# Patient Record
Sex: Female | Born: 1995 | Race: White | Hispanic: No | Marital: Married | Smoking: Never smoker
Health system: Southern US, Community
[De-identification: ages and names within clinical notes are randomized; demographics above are authoritative.]

## PROBLEM LIST (undated history)

## (undated) DIAGNOSIS — Q633 Hyperplastic and giant kidney: Secondary | ICD-10-CM

## (undated) DIAGNOSIS — R519 Headache, unspecified: Secondary | ICD-10-CM

## (undated) DIAGNOSIS — L659 Nonscarring hair loss, unspecified: Secondary | ICD-10-CM

## (undated) DIAGNOSIS — G4730 Sleep apnea, unspecified: Secondary | ICD-10-CM

## (undated) HISTORY — DX: Hyperplastic and giant kidney: Q63.3

## (undated) HISTORY — DX: Headache, unspecified: R51.9

## (undated) HISTORY — PX: NASAL SINUS SURGERY: SHX719

---
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
## 2014-05-01 IMAGING — CT CT PARANASAL SINUSES W/O CM
1 of 2 series · 13 of 30 positions shown, 17 images · non-contrast
Comparison: none

REASON FOR EXAM: Chronic Sinistius With Landmark See Scanned Notes
Protocol
COMMENTS:

PROCEDURE:     CT  - CT SINUSES WITHOUT CONTRAST  - May 22, 2012  [DATE]
RESULT:
TECHNIQUE: Multiplanar imaging of the paranasal sinuses was obtained
utilizing helical 2 mm acquisition and bone reconstruction algorithm.

[Series 3: axial facial 2.0 h70h · axial · 0.32mm/px · z∈[+1152,+1304]mm · 13 of 177 slices shown, 17 images]
[im 13/177  brain]
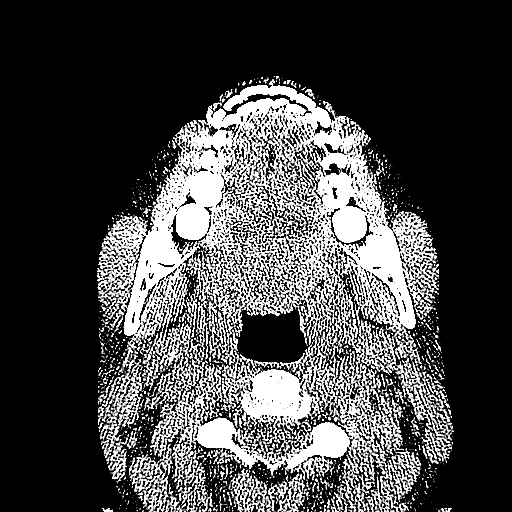
[im 13/177  bone]
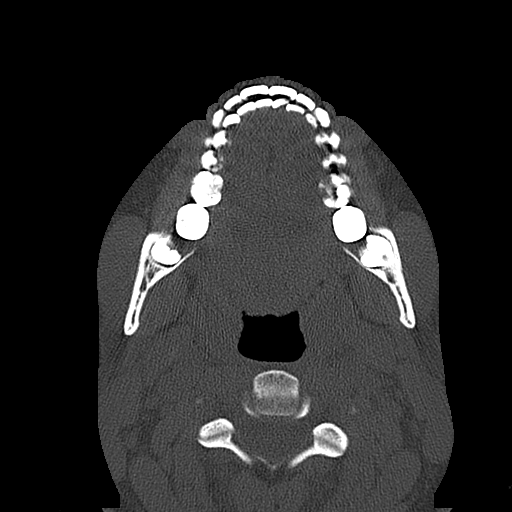
[im 26/177  bone]
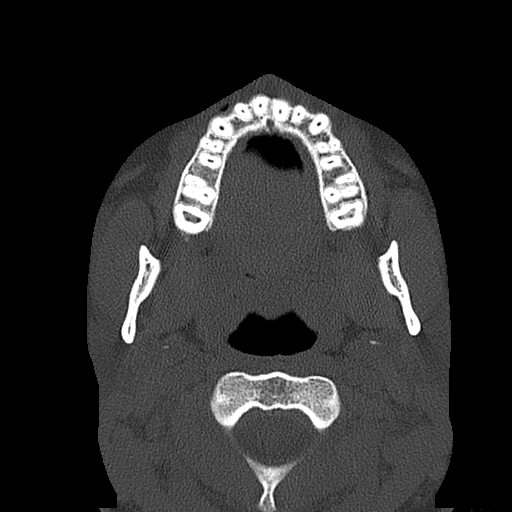
[im 38/177  bone]
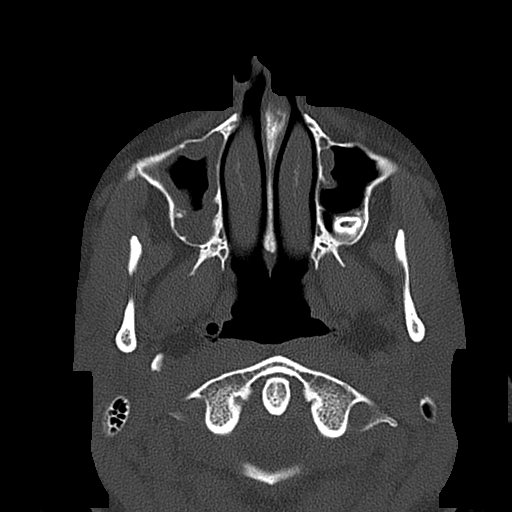
[im 51/177  bone]
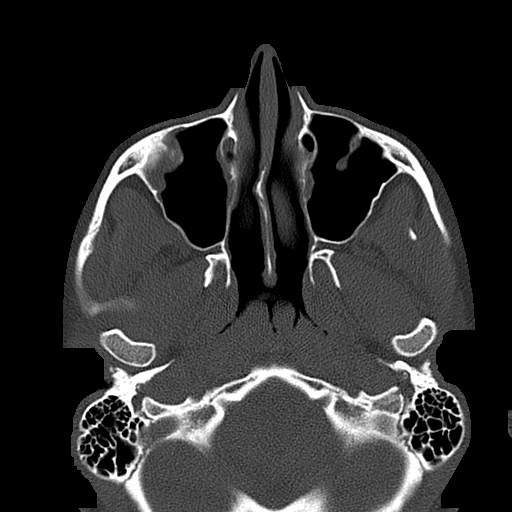
[im 63/177  brain]
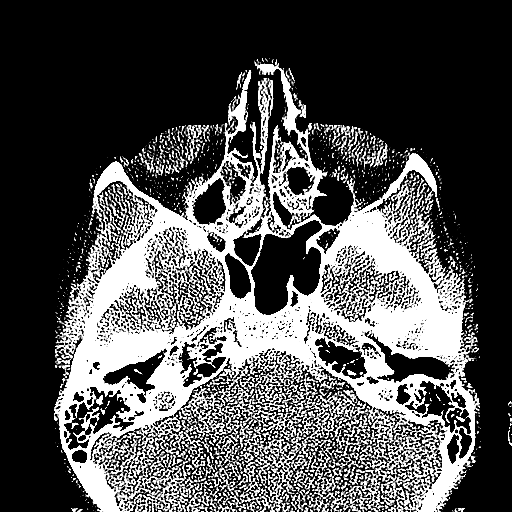
[im 63/177  bone]
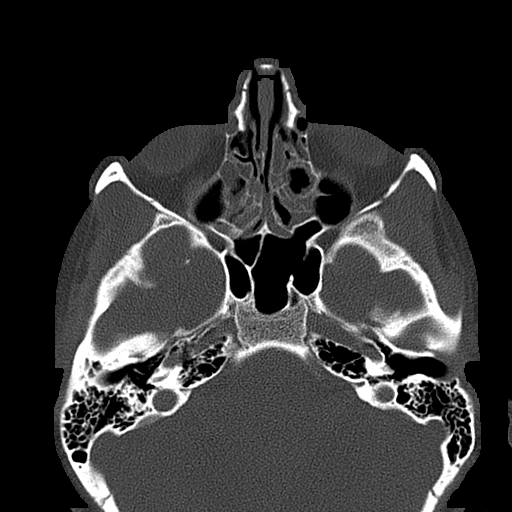
[im 76/177  bone]
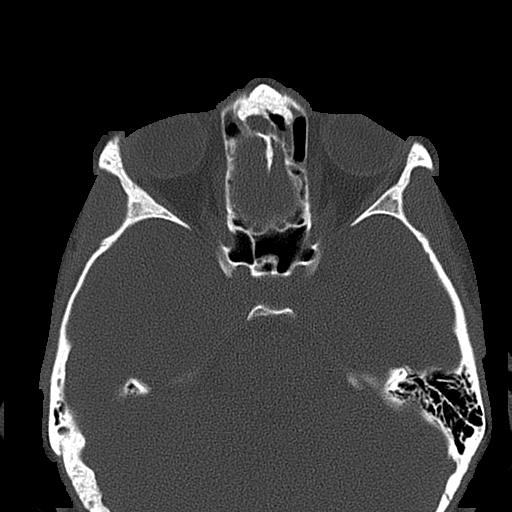
[im 89/177  bone]
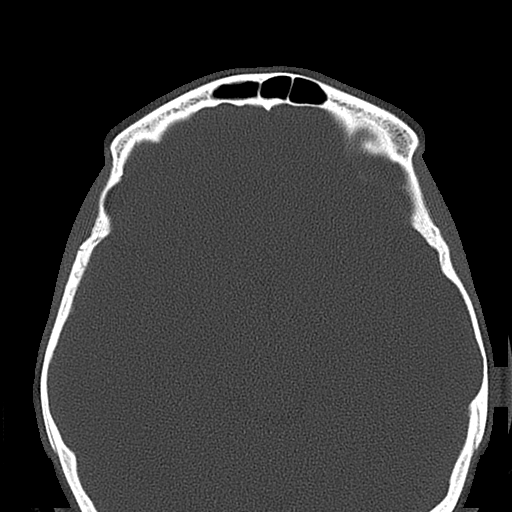
[im 101/177  bone]
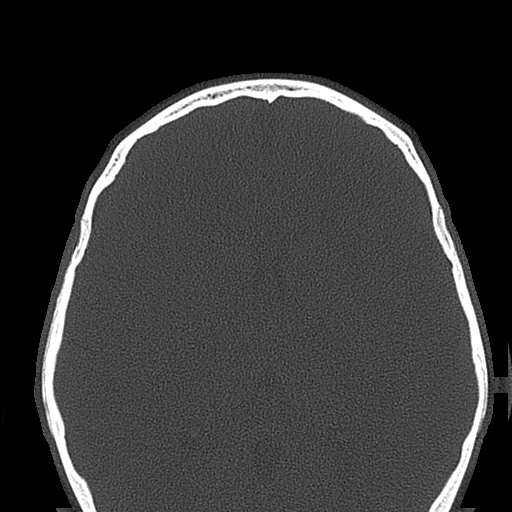
[im 114/177  brain]
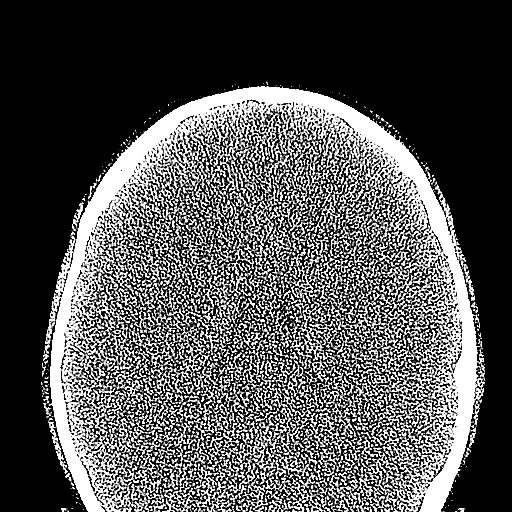
[im 114/177  bone]
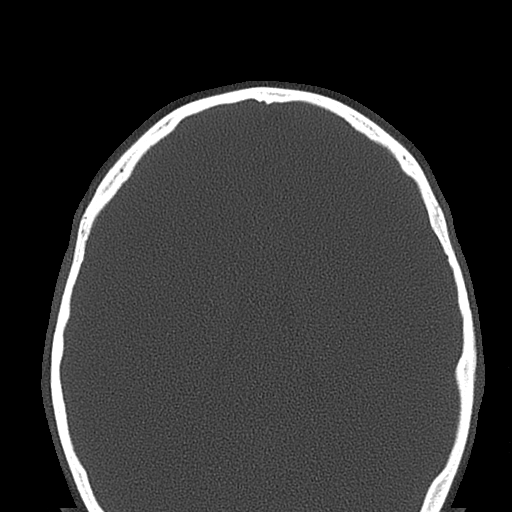
[im 126/177  bone]
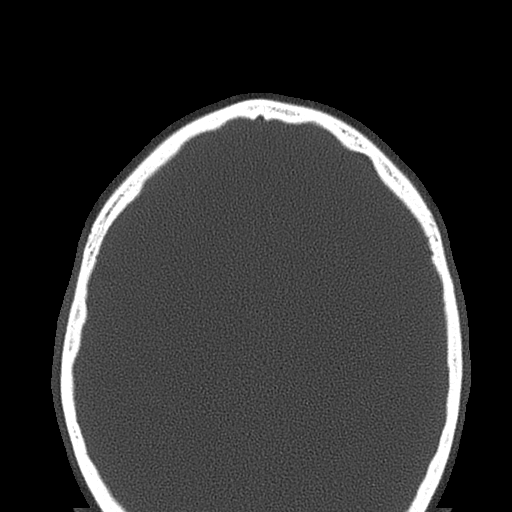
[im 139/177  bone]
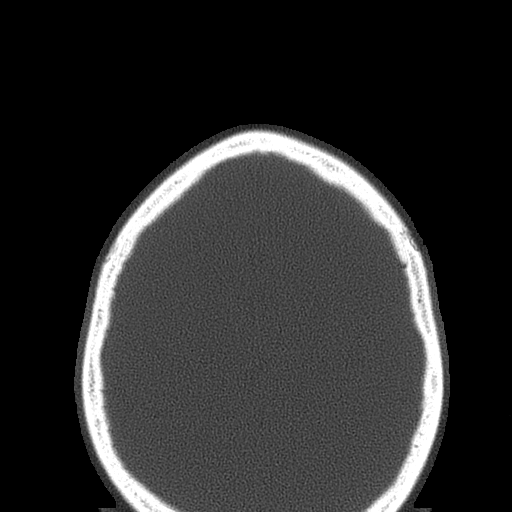
[im 151/177  bone]
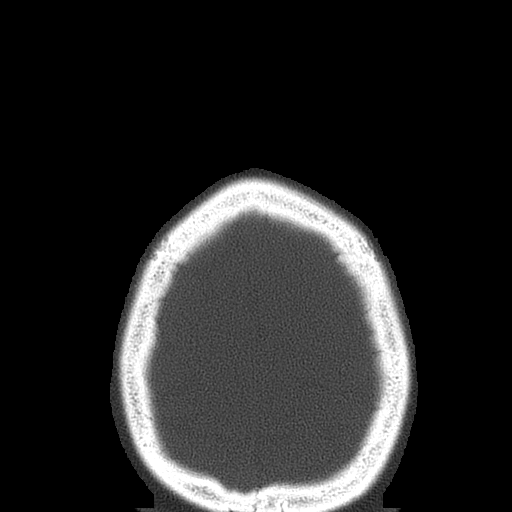
[im 164/177  brain]
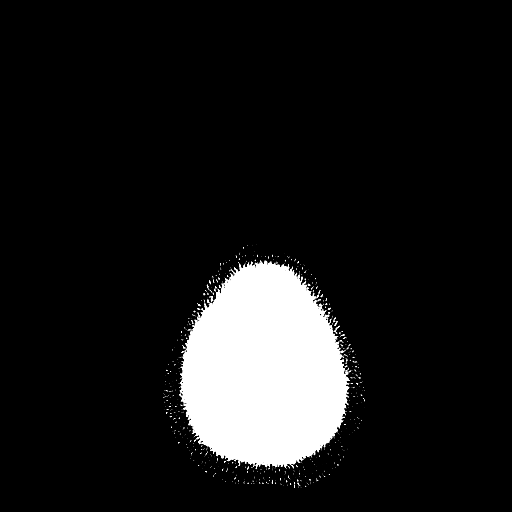
[im 164/177  bone]
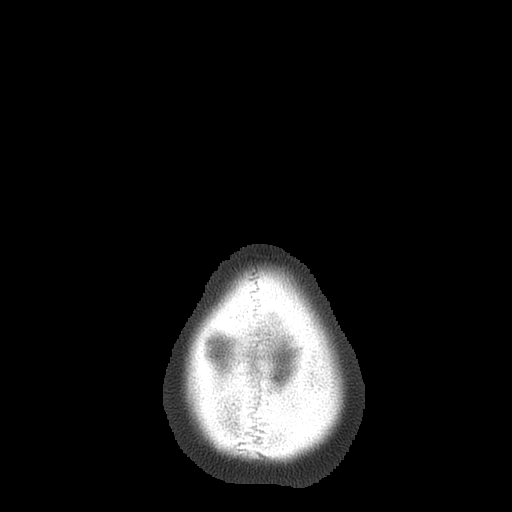

[13 of 30 positions shown; findings below may reference images not displayed]

FINDINGS: Diffuse mucosal thickening is identified within the ethmoid air
cells, right maxillary sinus, and to a lesser extent the left maxillary
sinus, and minimal regions in the frontal sinuses. There are findings
concerning for an impaction of the third molar within the base of the left
maxillary sinus. Concha bullosa is identified involving the superior and
middle turbinates. The turbinates are otherwise unremarkable. There is mild
to moderate ostial narrowing on the ostia of the right maxillary sinus with
near complete stenosis on the left.
IMPRESSION: 1.  Findings concerning for sinus disease within the ethmoid air cells,
right maxillary sinus and to a lesser extent the frontal sinus and left
maxillary sinus.
2.  Findings concerning for a component of impaction of the third molar into
the left maxillary sinus.

## 2015-11-08 DIAGNOSIS — G43111 Migraine with aura, intractable, with status migrainosus: Secondary | ICD-10-CM | POA: Insufficient documentation

## 2015-11-10 ENCOUNTER — Other Ambulatory Visit: Payer: Self-pay | Admitting: Neurology

## 2015-11-10 DIAGNOSIS — G43111 Migraine with aura, intractable, with status migrainosus: Secondary | ICD-10-CM

## 2015-11-25 ENCOUNTER — Ambulatory Visit
Admission: RE | Admit: 2015-11-25 | Discharge: 2015-11-25 | Disposition: A | Discharge status: Home / Self Care | Payer: BLUE CROSS/BLUE SHIELD | Source: Ambulatory Visit | Attending: Neurology | Admitting: Neurology

## 2015-11-25 DIAGNOSIS — R600 Localized edema: Secondary | ICD-10-CM | POA: Diagnosis not present

## 2015-11-25 DIAGNOSIS — G43111 Migraine with aura, intractable, with status migrainosus: Secondary | ICD-10-CM | POA: Diagnosis not present

## 2015-11-25 DIAGNOSIS — J3489 Other specified disorders of nose and nasal sinuses: Secondary | ICD-10-CM | POA: Diagnosis not present

## 2015-11-25 MED ORDER — GADOBENATE DIMEGLUMINE 529 MG/ML IV SOLN
15.0000 mL | Freq: Once | INTRAVENOUS | Status: AC | PRN
  Administered 2015-11-25: 15 mL via INTRAVENOUS

## 2016-03-19 ENCOUNTER — Encounter: Payer: Self-pay | Admitting: *Deleted

## 2016-03-19 DIAGNOSIS — J069 Acute upper respiratory infection, unspecified: Secondary | ICD-10-CM | POA: Insufficient documentation

## 2016-03-19 DIAGNOSIS — R091 Pleurisy: Secondary | ICD-10-CM | POA: Insufficient documentation

## 2016-03-19 DIAGNOSIS — R509 Fever, unspecified: Secondary | ICD-10-CM | POA: Diagnosis present

## 2016-03-19 LAB — URINALYSIS, COMPLETE (UACMP) WITH MICROSCOPIC
Bilirubin Urine: NEGATIVE
Glucose, UA: NEGATIVE mg/dL
Hgb urine dipstick: NEGATIVE
Ketones, ur: NEGATIVE mg/dL
Leukocytes, UA: NEGATIVE
Nitrite: NEGATIVE
PH: 7 (ref 5.0–8.0)
Protein, ur: NEGATIVE mg/dL
RBC / HPF: NONE SEEN RBC/hpf (ref 0–5)
SPECIFIC GRAVITY, URINE: 1.008 (ref 1.005–1.030)

## 2016-03-19 LAB — POCT PREGNANCY, URINE: Preg Test, Ur: NEGATIVE

## 2016-03-19 MED ORDER — ACETAMINOPHEN 500 MG PO TABS
1000.0000 mg | ORAL_TABLET | Freq: Once | ORAL | Status: AC
  Administered 2016-03-19: 1000 mg via ORAL

## 2016-03-19 MED ORDER — ACETAMINOPHEN 500 MG PO TABS
ORAL_TABLET | ORAL | Status: AC
  Filled 2016-03-19: qty 2

## 2016-03-19 NOTE — ED Triage Notes (Signed)
Pt reports back pain, chills, cough, and bodyaches.  Sx for 1day.  Pt has a headache.  Pt alert.  Speech clear.

## 2016-03-20 ENCOUNTER — Emergency Department
Admission: EM | Admit: 2016-03-20 | Discharge: 2016-03-20 | Disposition: A | Discharge status: Home / Self Care | Payer: BLUE CROSS/BLUE SHIELD | Attending: Emergency Medicine | Admitting: Emergency Medicine

## 2016-03-20 ENCOUNTER — Emergency Department: Payer: BLUE CROSS/BLUE SHIELD

## 2016-03-20 DIAGNOSIS — R091 Pleurisy: Secondary | ICD-10-CM

## 2016-03-20 DIAGNOSIS — J069 Acute upper respiratory infection, unspecified: Secondary | ICD-10-CM

## 2016-03-20 LAB — INFLUENZA PANEL BY PCR (TYPE A & B)
INFLBPCR: NEGATIVE
Influenza A By PCR: NEGATIVE

## 2016-03-20 MED ORDER — IBUPROFEN 600 MG PO TABS
600.0000 mg | ORAL_TABLET | Freq: Once | ORAL | Status: AC
  Administered 2016-03-20: 600 mg via ORAL
  Filled 2016-03-20: qty 1

## 2016-03-20 NOTE — ED Notes (Signed)
Report off to kailey rn  

## 2016-03-20 NOTE — ED Provider Notes (Signed)
[facility] Emergency Department Provider Note  ____________________________________________   First MD Initiated Contact with Patient 03/20/16 0157     (approximate)  I have reviewed the triage vital signs and the nursing notes.   HISTORY  Chief Complaint Back Pain   HPI Tracy Stevens is a 21 y.o. female without any chronic medical conditions was presenting emergency Department with 1 day of fever, congestion, frontal head pressure as well as cough and low back pain and body aches. She also says that she is having chest pain with a cough. 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 Says that she is also been diagnosed with the flu once this year.   No past medical history on file.  There are no active problems to display for this patient.   No past surgical history on file.  Prior to Admission medications   Not on File    Allergies Patient has no known allergies.  No family history on file.  Social History Social History  Substance Use Topics  . Smoking status: Never Smoker  . Smokeless tobacco: Never Used  . Alcohol use No    Review of Systems Constitutional: Fever Eyes: No visual changes. ENT: No sore throat. Cardiovascular: Diffuse chest pain also with deep breathing. Says this is chronic but worsened over the past day. Respiratory: Cough Gastrointestinal: No abdominal pain.  No nausea, no vomiting.  No diarrhea.  No constipation. Genitourinary: Negative for dysuria. Musculoskeletal: As above Skin: Negative for rash. Neurological: Negative for headaches, focal weakness or numbness.  10-point ROS otherwise negative.  ____________________________________________   PHYSICAL EXAM:  VITAL SIGNS: ED Triage Vitals  Enc Vitals Group     BP 03/19/16 2228 (!) 151/92     Pulse Rate 03/19/16 2228 (!) 128     Resp 03/19/16 2228 20     Temp 03/19/16 2228 100.3 F (37.9 C)     Temp Source 03/19/16 2228  Oral     SpO2 03/19/16 2228 100 %     Weight 03/19/16 2229 180 lb (81.6 kg)     Height 03/19/16 2229 5\' 5"  (1.651 m)     Head Circumference --      Peak Flow --      Pain Score 03/19/16 2229 8     Pain Loc --      Pain Edu? --      Excl. in GC? --     Constitutional: Alert and oriented. Well appearing and in no acute distress.  Temperature retaken and is 98.6. Eyes: Conjunctivae are normal. PERRL. EOMI. Head: Atraumatic. Nose: No congestion/rhinnorhea. Mouth/Throat: Mucous membranes are moist.  Oropharynx non-erythematous. Neck: No stridor.   Cardiovascular: Normal rate, regular rhythm. Grossly normal heart sounds.   Respiratory: Normal respiratory effort.  No retractions. Lungs CTAB. Gastrointestinal: Soft and nontender. No distention.  No CVA tenderness. Musculoskeletal: No lower extremity tenderness nor edema.  No joint effusions. Tenderness palpation to the thoracic nor her spine region. Neurologic:  Normal speech and language. No gross focal neurologic deficits are appreciated.  Skin:  Skin is warm, dry and intact. No rash noted. Psychiatric: Mood and affect are normal. Speech and behavior are normal.  ____________________________________________   LABS (all labs ordered are listed, but only abnormal results are displayed)  Labs Reviewed  URINALYSIS, COMPLETE (UACMP) WITH MICROSCOPIC - Abnormal; Notable for the following:       Result Value   Color, Urine STRAW (*)  APPearance CLEAR (*)    Bacteria, UA RARE (*)    Squamous Epithelial / LPF 0-5 (*)    All other components within normal limits  INFLUENZA PANEL BY PCR (TYPE A & B)  POC URINE PREG, ED  POCT PREGNANCY, URINE   ____________________________________________  EKG  ED ECG REPORT I, Schaevitz,  Teena Irani, the attending physician, personally viewed and interpreted this ECG.   Date: 03/20/2016  EKG Time: 0302  Rate: 90  Rhythm: normal sinus rhythm  Axis: Normal  Intervals:none  ST&T Change: No ST  segment elevation or depression. No abnormal T-wave inversion.  ____________________________________________  RADIOLOGY  DG Chest 2 View (Final result)  Result time 03/20/16 02:51:19  Final result by Charlott Holler, MD (03/20/16 02:51:19)           Narrative:   CLINICAL DATA: Cough, chills and back pain for 1 day.  EXAM: CHEST 2 VIEW  COMPARISON: None.  FINDINGS: The heart size and mediastinal contours are within normal limits. Both lungs are clear. The visualized skeletal structures are unremarkable.  IMPRESSION: No active cardiopulmonary disease.   Electronically Signed By: Ellery Plunk M.D. On: 03/20/2016 02:51            ____________________________________________   PROCEDURES  Procedure(s) performed:   Procedures  Critical Care performed:   ____________________________________________   INITIAL IMPRESSION / ASSESSMENT AND PLAN / ED COURSE  Pertinent labs & imaging results that were available during my care of the patient were reviewed by me and considered in my medical decision making (see chart for details).  ----------------------------------------- 3:54 AM on 03/20/2016 -----------------------------------------  Heart rate now 80. Patient says that the aching is improved. Likely viral illness causing her constellation of symptoms. Patient does have an element of pleurisy but very unlikely to be PE given her fever as well as nasal congestion and frontal headache. Most likely viral URI with pleurisy. She'll be following up with the [facility] where she is seen for primary care. She is understanding of the plan and willing to comply.      ____________________________________________   FINAL CLINICAL IMPRESSION(S) / ED DIAGNOSES  Pleurisy. URI.    NEW MEDICATIONS STARTED DURING THIS VISIT:  New Prescriptions   No medications on file     Note:  This document was prepared using Dragon voice recognition  software and may include unintentional dictation errors.    Myrna Blazer, MD 03/20/16 [phone]

## 2018-02-27 IMAGING — CR DG CHEST 2V
2 series · 2 of 2 positions shown · non-contrast
Comparison: None.

CLINICAL DATA: Cough, chills and back pain for 1 day.

EXAM:
CHEST  2 VIEW

[chest pa]
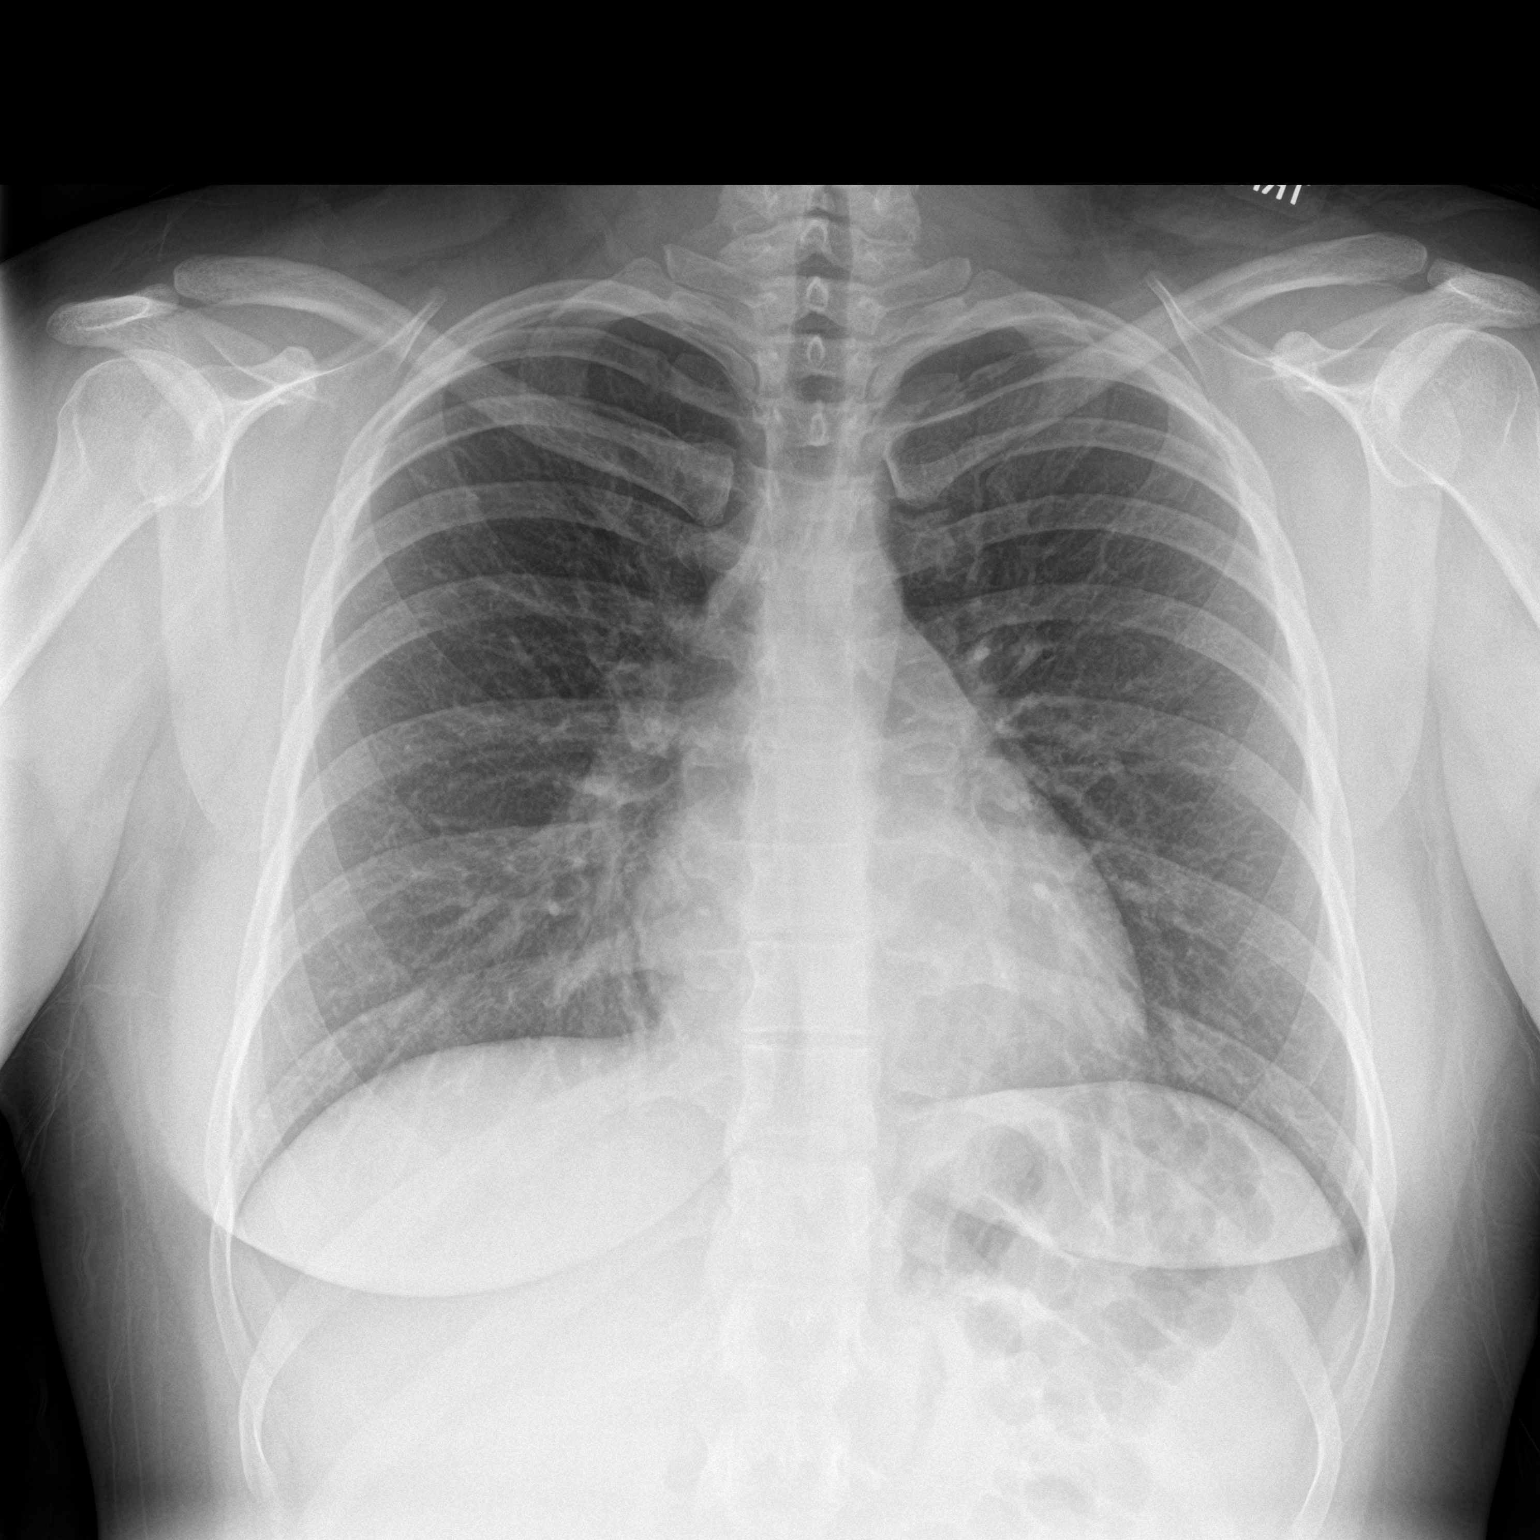

[chest lat]
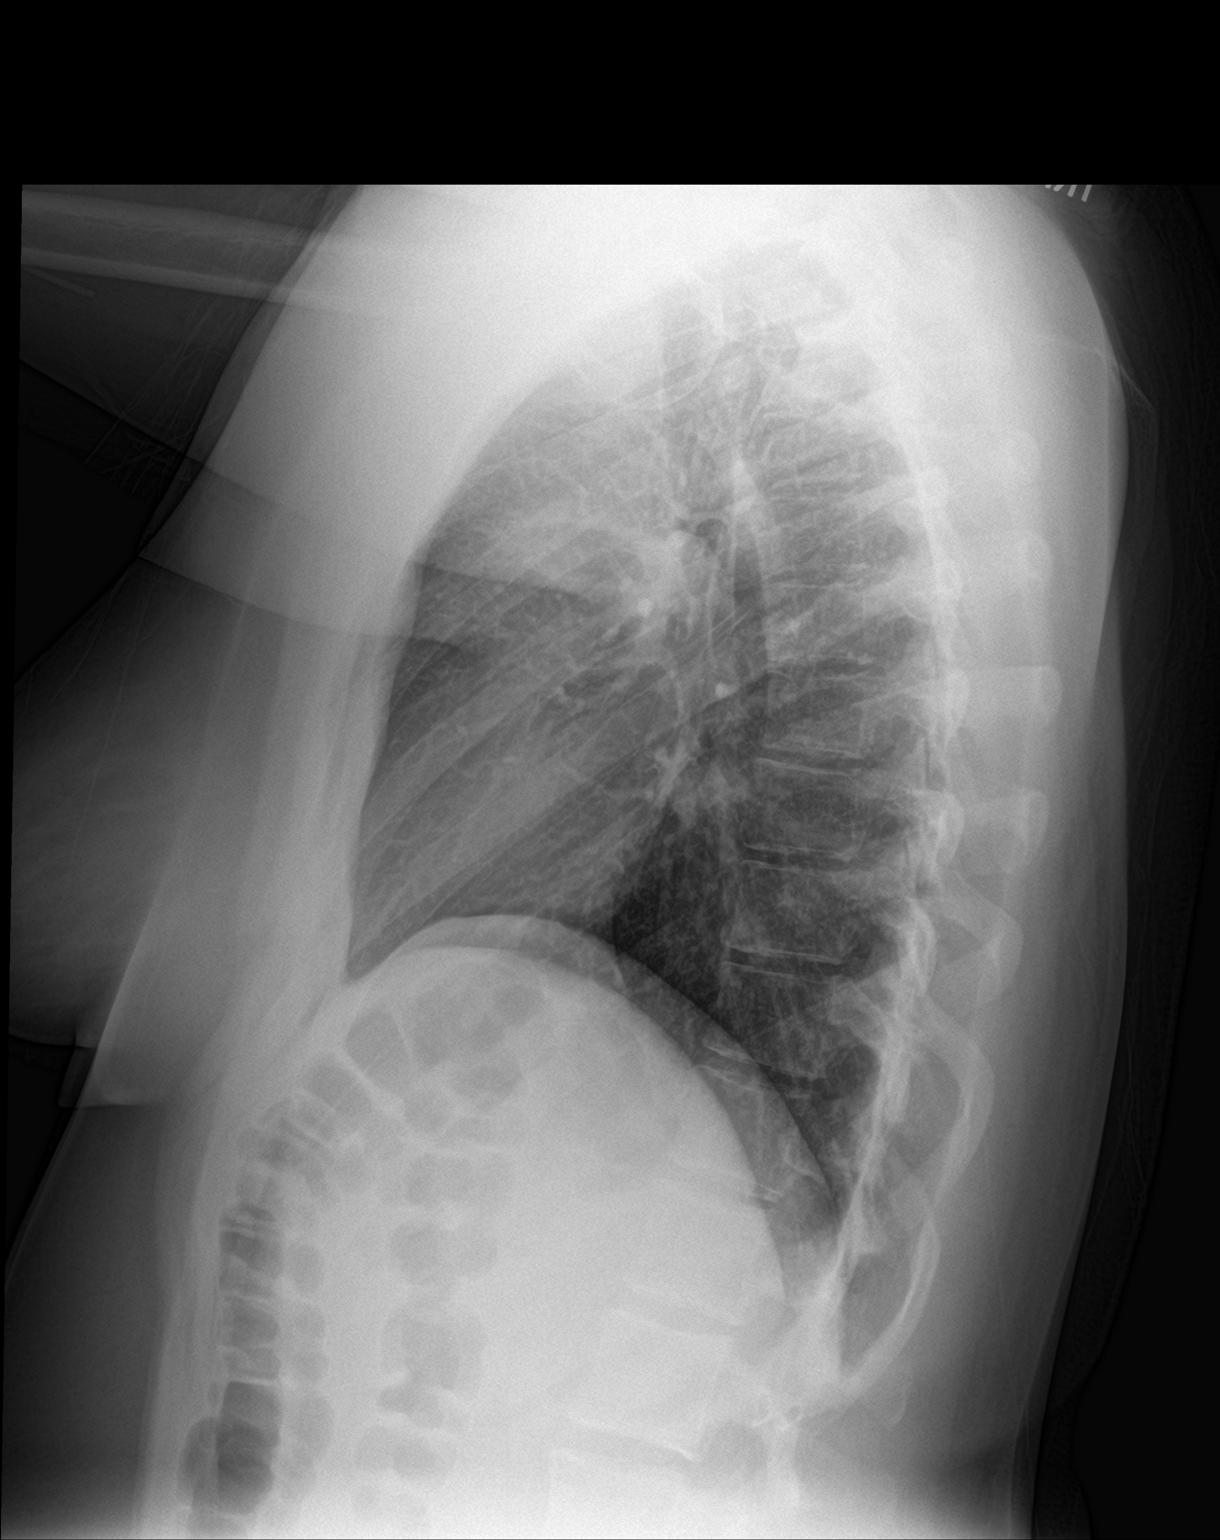

[2 of 2 positions shown; findings below may reference images not displayed]

FINDINGS: The heart size and mediastinal contours are within normal limits.
Both lungs are clear. The visualized skeletal structures are
unremarkable.
IMPRESSION: No active cardiopulmonary disease.

## 2018-08-15 ENCOUNTER — Other Ambulatory Visit: Payer: Self-pay

## 2018-08-15 ENCOUNTER — Ambulatory Visit (INDEPENDENT_AMBULATORY_CARE_PROVIDER_SITE_OTHER): Payer: Managed Care, Other (non HMO) | Admitting: Obstetrics and Gynecology

## 2018-08-15 VITALS — BP 112/78 | HR 80 | Ht 64.0 in | Wt 209.3 lb

## 2018-08-15 DIAGNOSIS — Z3401 Encounter for supervision of normal first pregnancy, first trimester: Secondary | ICD-10-CM

## 2018-08-15 NOTE — Progress Notes (Cosign Needed)
Tracy Stevens presents for NOB nurse interview visit. Pregnancy confirmation done 08/12/2018 at [facility] Primary G1. P0. Pregnancy education material explained and given. 0 cats in home. NOB labs ordered. TSH/HbgA1c ordered due to BMI 30 or greater. Sickle cell ordered due to patient's race. HIV labs and drug screen were explained and ordered. PNV encouraged. Genetic screening options discussed. Genetic testing: Unsure . Patient may discuss with the provider. Patient to follow up with provider next week for NOB physical. All questions answered. Patient is unsure of exact date of LMP. We will schedule dating [place].   We did not have lab for nurse intake. Labs will need to be done at [facility].

## 2018-08-19 ENCOUNTER — Other Ambulatory Visit: Payer: Self-pay | Admitting: Obstetrics and Gynecology

## 2018-08-19 DIAGNOSIS — Z3491 Encounter for supervision of normal pregnancy, unspecified, first trimester: Secondary | ICD-10-CM

## 2018-08-21 ENCOUNTER — Telehealth: Payer: Self-pay | Admitting: Obstetrics and Gynecology

## 2018-08-21 ENCOUNTER — Other Ambulatory Visit: Payer: Self-pay

## 2018-08-21 ENCOUNTER — Ambulatory Visit (INDEPENDENT_AMBULATORY_CARE_PROVIDER_SITE_OTHER): Payer: Managed Care, Other (non HMO)

## 2018-08-21 ENCOUNTER — Other Ambulatory Visit: Payer: Managed Care, Other (non HMO)

## 2018-08-21 DIAGNOSIS — Z3491 Encounter for supervision of normal pregnancy, unspecified, first trimester: Secondary | ICD-10-CM | POA: Diagnosis not present

## 2018-08-21 NOTE — Telephone Encounter (Signed)
Pt requesting call back pt advised by Dannielle Huh its to early to have labs done. Please advise.

## 2018-08-22 NOTE — Telephone Encounter (Signed)
Called and spoke with patient.  Patient was told by ultrasound tech that she was measuring too early and it was too early to do bloodwork.  I advised patient that [facility]NS will see ultrasound report and discuss further follow-up appointment.  Patient verbalized understanding.

## 2018-08-27 NOTE — Telephone Encounter (Signed)
pls schedule for follow up dating viability in 10 days

## 2018-08-27 NOTE — Telephone Encounter (Signed)
Patient schedule ultrasound appointment on 9/2. She stated she is off work that day.

## 2018-08-27 NOTE — Telephone Encounter (Signed)
Please let her know I have reviewed the ultrasound and agree it is too early for her blood work. Make sure she has an ultrasound for 10 days out though.

## 2018-09-17 ENCOUNTER — Other Ambulatory Visit: Payer: Self-pay

## 2018-09-17 ENCOUNTER — Ambulatory Visit (INDEPENDENT_AMBULATORY_CARE_PROVIDER_SITE_OTHER): Payer: Managed Care, Other (non HMO)

## 2018-09-17 ENCOUNTER — Other Ambulatory Visit: Payer: Self-pay | Admitting: Obstetrics and Gynecology

## 2018-09-17 DIAGNOSIS — Z3492 Encounter for supervision of normal pregnancy, unspecified, second trimester: Secondary | ICD-10-CM | POA: Diagnosis not present

## 2018-09-30 ENCOUNTER — Other Ambulatory Visit: Payer: Self-pay

## 2018-09-30 ENCOUNTER — Ambulatory Visit (INDEPENDENT_AMBULATORY_CARE_PROVIDER_SITE_OTHER): Payer: Managed Care, Other (non HMO) | Admitting: Obstetrics and Gynecology

## 2018-09-30 ENCOUNTER — Encounter: Payer: Self-pay | Admitting: Obstetrics and Gynecology

## 2018-09-30 VITALS — BP 128/74 | HR 74 | Wt 198.5 lb

## 2018-09-30 DIAGNOSIS — M545 Low back pain, unspecified: Secondary | ICD-10-CM | POA: Insufficient documentation

## 2018-09-30 DIAGNOSIS — Z3491 Encounter for supervision of normal pregnancy, unspecified, first trimester: Secondary | ICD-10-CM

## 2018-09-30 DIAGNOSIS — Z6835 Body mass index (BMI) 35.0-35.9, adult: Secondary | ICD-10-CM | POA: Insufficient documentation

## 2018-09-30 DIAGNOSIS — Z803 Family history of malignant neoplasm of breast: Secondary | ICD-10-CM | POA: Insufficient documentation

## 2018-09-30 NOTE — Progress Notes (Signed)
NEW OB HISTORY AND PHYSICAL  SUBJECTIVE:       Tracy Stevens is a 23 y.o. G1P0 femGlynda Stevens, Patient's last menstrual period was 07/12/2018., Estimated Date of Delivery: 04/18/19, [id number], presents today for establishment of Prenatal Care. 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 Had one episode of muscle spasm and low back pain- used heat, stretches and tylenol w/ relief after a day or so.     Gynecologic History Patient's last menstrual period was 07/12/2018. Normal Contraception: none Last Pap: 2019. Results were: normal  Obstetric History OB History  Gravida Para Term Preterm AB Living  1            SAB TAB Ectopic Multiple Live Births               # Outcome Date GA Lbr Len/2nd Weight Sex Delivery Anes PTL Lv  1 Current             No past medical history on file.    Current Outpatient Medications on File Prior to Visit  Medication Sig Dispense Refill  . acetaminophen (TYLENOL) 325 MG tablet Take 650 mg by mouth every 6 (six) hours as needed.    . Prenatal Vit-Fe Fumarate-FA (PRENATAL MULTIVITAMIN) TABS tablet Take 1 tablet by mouth daily at 12 noon.     No current facility-administered medications on file prior to visit.     No Known Allergies  Social History   Socioeconomic History  . Marital status: Single    Spouse name: Not on file  . Number of children: Not on file  . Years of education: Not on file  . Highest education level: Not on file  Occupational History  . Not on file  Social Needs  . Financial resource strain: Not on file  . Food insecurity    Worry: Not on file    Inability: Not on file  . Transportation needs    Medical: Not on file    Non-medical: Not on file  Tobacco Use  . Smoking status: Never Smoker  . Smokeless tobacco: Never Used  Substance and Sexual  Activity  . Alcohol use: No  . Drug use: Not on file  . Sexual activity: Not on file  Lifestyle  . Physical activity    Days per week: Not on file    Minutes per session: Not on file  . Stress: Not on file  Relationships  . Social Musicianconnections    Talks on phone: Not on file    Gets together: Not on file    Attends religious service: Not on file    Active member of club or organization: Not on file    Attends meetings of clubs or organizations: Not on file    Relationship status: Not on file  . Intimate partner violence    Fear of current or ex partner: Not on file    Emotionally abused: Not on file    Physically abused: Not on file    Forced sexual activity: Not on file  Other Topics Concern  . Not on file  Social History Narrative  . Not on file    No family history on file.  The following portions of the patient's history were reviewed and updated as appropriate: allergies, current medications, past OB history, past medical history, past  surgical history, past family history, past social history, and problem list.    OBJECTIVE: Initial Physical Exam (New OB)  GENERAL APPEARANCE: alert, well appearing, in no apparent distress, oriented to person, place and time, overweight HEAD: normocephalic, atraumatic MOUTH: mucous membranes moist, pharynx normal without lesions and dental hygiene good THYROID: no thyromegaly or masses present BREASTS: no masses noted, no significant tenderness, no palpable axillary nodes, no skin changes LUNGS: clear to auscultation, no wheezes, rales or rhonchi, symmetric air entry HEART: regular rate and rhythm, no murmurs ABDOMEN: soft, nontender, nondistended, no abnormal masses, no epigastric pain, fundus not palpable and FHT present EXTREMITIES: no redness or tenderness in the calves or thighs SKIN: normal coloration and turgor, no rashes LYMPH NODES: no adenopathy palpable NEUROLOGIC: alert, oriented, normal speech, no focal findings or  movement disorder noted  PELVIC EXAM not indicated.  ASSESSMENT: Normal pregnancy BMI 35 Low back pain Family h/o breast cancer   PLAN: Prenatal care discussed Genetic screening obtained today Early glucola next visit Discussed routine treatment of back pain and possible PT needed,  See orders

## 2018-09-30 NOTE — Patient Instructions (Signed)

## 2018-09-30 NOTE — Progress Notes (Signed)
NOB PE- pt is having nauesa, having pain lower back

## 2018-10-01 LAB — URINALYSIS, ROUTINE W REFLEX MICROSCOPIC
Bilirubin, UA: NEGATIVE
Glucose, UA: NEGATIVE
Ketones, UA: NEGATIVE
Leukocytes,UA: NEGATIVE
Nitrite, UA: NEGATIVE
Protein,UA: NEGATIVE
RBC, UA: NEGATIVE
Specific Gravity, UA: 1.016 (ref 1.005–1.030)
Urobilinogen, Ur: 0.2 mg/dL (ref 0.2–1.0)
pH, UA: 5.5 (ref 5.0–7.5)

## 2018-10-01 LAB — RPR: RPR Ser Ql: NONREACTIVE

## 2018-10-01 LAB — HIV ANTIBODY (ROUTINE TESTING W REFLEX): HIV Screen 4th Generation wRfx: NONREACTIVE

## 2018-10-01 LAB — ANTIBODY SCREEN: Antibody Screen: NEGATIVE

## 2018-10-01 LAB — RUBELLA SCREEN: Rubella Antibodies, IGG: 1.34 index (ref >0.99)

## 2018-10-01 LAB — VARICELLA ZOSTER ANTIBODY, IGG: Varicella zoster IgG: <135 index — ABNORMAL LOW (ref >165)

## 2018-10-01 LAB — ABO AND RH: Rh Factor: POSITIVE

## 2018-10-01 LAB — TSH: TSH: 1.37 u[IU]/mL (ref 0.450–4.500)

## 2018-10-01 LAB — HGB SOLU + RFLX FRAC: Sickle Solubility Test - HGBRFX: NEGATIVE

## 2018-10-01 LAB — HEMOGLOBIN A1C
Est. average glucose Bld gHb Est-mCnc: 100 mg/dL
Hgb A1c MFr Bld: 5.1 % (ref 4.8–5.6)

## 2018-10-01 LAB — HEPATITIS B SURFACE ANTIGEN: Hepatitis B Surface Ag: NEGATIVE

## 2018-10-02 ENCOUNTER — Other Ambulatory Visit: Payer: Self-pay | Admitting: Obstetrics and Gynecology

## 2018-10-02 DIAGNOSIS — Z2839 Other underimmunization status: Secondary | ICD-10-CM | POA: Insufficient documentation

## 2018-10-02 DIAGNOSIS — O09899 Supervision of other high risk pregnancies, unspecified trimester: Secondary | ICD-10-CM | POA: Insufficient documentation

## 2018-10-02 LAB — MONITOR DRUG PROFILE 14(MW)
Amphetamine Scrn, Ur: NEGATIVE ng/mL
BARBITURATE SCREEN URINE: NEGATIVE ng/mL
BENZODIAZEPINE SCREEN, URINE: NEGATIVE ng/mL
Buprenorphine, Urine: NEGATIVE ng/mL
CANNABINOIDS UR QL SCN: NEGATIVE ng/mL
Cocaine (Metab) Scrn, Ur: NEGATIVE ng/mL
Creatinine(Crt), U: 125.2 mg/dL (ref 20.0–300.0)
Fentanyl, Urine: NEGATIVE pg/mL
Meperidine Screen, Urine: NEGATIVE ng/mL
Methadone Screen, Urine: NEGATIVE ng/mL
OXYCODONE+OXYMORPHONE UR QL SCN: NEGATIVE ng/mL
Opiate Scrn, Ur: NEGATIVE ng/mL
Ph of Urine: 5.3 (ref 4.5–8.9)
Phencyclidine Qn, Ur: NEGATIVE ng/mL
Propoxyphene Scrn, Ur: NEGATIVE ng/mL
SPECIFIC GRAVITY: 1.02
Tramadol Screen, Urine: NEGATIVE ng/mL

## 2018-10-02 LAB — GC/CHLAMYDIA PROBE AMP
Chlamydia trachomatis, NAA: NEGATIVE
Neisseria Gonorrhoeae by PCR: NEGATIVE

## 2018-10-02 LAB — URINE CULTURE

## 2018-10-10 ENCOUNTER — Encounter: Payer: Self-pay | Admitting: *Deleted

## 2018-10-30 ENCOUNTER — Ambulatory Visit (INDEPENDENT_AMBULATORY_CARE_PROVIDER_SITE_OTHER): Payer: Managed Care, Other (non HMO) | Admitting: Certified Nurse Midwife

## 2018-10-30 ENCOUNTER — Other Ambulatory Visit: Payer: Managed Care, Other (non HMO)

## 2018-10-30 ENCOUNTER — Other Ambulatory Visit: Payer: Self-pay

## 2018-10-30 VITALS — BP 121/71 | HR 88 | Wt 192.2 lb

## 2018-10-30 DIAGNOSIS — N3941 Urge incontinence: Secondary | ICD-10-CM

## 2018-10-30 DIAGNOSIS — Z6835 Body mass index (BMI) 35.0-35.9, adult: Secondary | ICD-10-CM

## 2018-10-30 DIAGNOSIS — R102 Pelvic and perineal pain: Secondary | ICD-10-CM

## 2018-10-30 DIAGNOSIS — Z3689 Encounter for other specified antenatal screening: Secondary | ICD-10-CM

## 2018-10-30 DIAGNOSIS — Z3A15 15 weeks gestation of pregnancy: Secondary | ICD-10-CM

## 2018-10-30 DIAGNOSIS — O26892 Other specified pregnancy related conditions, second trimester: Secondary | ICD-10-CM | POA: Insufficient documentation

## 2018-10-30 DIAGNOSIS — Z3492 Encounter for supervision of normal pregnancy, unspecified, second trimester: Secondary | ICD-10-CM

## 2018-10-30 DIAGNOSIS — Z131 Encounter for screening for diabetes mellitus: Secondary | ICD-10-CM

## 2018-10-30 LAB — POCT URINALYSIS DIPSTICK OB
Bilirubin, UA: NEGATIVE
Blood, UA: NEGATIVE
Glucose, UA: NEGATIVE
Nitrite, UA: NEGATIVE
Spec Grav, UA: 1.01 (ref 1.010–1.025)
Urobilinogen, UA: 0.2 E.U./dL
pH, UA: 7.5 (ref 5.0–8.0)

## 2018-10-30 NOTE — Progress Notes (Signed)
ROB-Patient c/o feeling like she can't really hold her urine "I go every hour" x2 weeks and lower pelvic "cramping" x2 weeks.

## 2018-10-30 NOTE — Patient Instructions (Addendum)
Back Pain in Pregnancy Back pain during pregnancy is common. Back pain may be caused by several factors that are related to changes during your pregnancy. Follow these instructions at home: Managing pain, stiffness, and swelling      If directed, for sudden (acute) back pain, put ice on the painful area. ? Put ice in a plastic bag. ? Place a towel between your skin and the bag. ? Leave the ice on for 20 minutes, 2-3 times per day.  If directed, apply heat to the affected area before you exercise. Use the heat source that your health care provider recommends, such as a moist heat pack or a heating pad. ? Place a towel between your skin and the heat source. ? Leave the heat on for 20-30 minutes. ? Remove the heat if your skin turns bright red. This is especially important if you are unable to feel pain, heat, or cold. You may have a greater risk of getting burned.  If directed, massage the affected area. Activity  Exercise as told by your health care provider. Gentle exercise is the best way to prevent or manage back pain.  Listen to your body when lifting. If lifting hurts, ask for help or bend your knees. This uses your leg muscles instead of your back muscles.  Squat down when picking up something from the floor. Do not bend over.  Only use bed rest for short periods as told by your health care provider. Bed rest should only be used for the most severe episodes of back pain. Standing, sitting, and lying down  Do not stand in one place for long periods of time.  Use good posture when sitting. Make sure your head rests over your shoulders and is not hanging forward. Use a pillow on your lower back if necessary.  Try sleeping on your side, preferably the left side, with a pregnancy support pillow or 1-2 regular pillows between your legs. ? If you have back pain after a night's rest, your bed may be too soft. ? A firm mattress may provide more support for your back during  pregnancy. General instructions  Do not wear high heels.  Eat a healthy diet. Try to gain weight within your health care provider's recommendations.  Use a maternity girdle, elastic sling, or back brace as told by your health care provider.  Take over-the-counter and prescription medicines only as told by your health care provider.  Work with a physical therapist or massage therapist to find ways to manage back pain. Acupuncture or massage therapy may be helpful.  Keep all follow-up visits as told by your health care provider. This is important. Contact a health care provider if:  Your back pain interferes with your daily activities.  You have increasing pain in other parts of your body. Get help right away if:  You develop numbness, tingling, weakness, or problems with the use of your arms or legs.  You develop severe back pain that is not controlled with medicine.  You have a change in bowel or bladder control.  You develop shortness of breath, dizziness, or you faint.  You develop nausea, vomiting, or sweating.  You have back pain that is a rhythmic, cramping pain similar to labor pains. Labor pain is usually 1-2 minutes apart, lasts for about 1 minute, and involves a bearing down feeling or pressure in your pelvis.  You have back pain and your water breaks or you have vaginal bleeding.  You have back pain or  that travels down your leg.  Your back pain developed after you fell.  You develop pain on one side of your back.  You see blood in your urine.  You develop skin blisters in the area of your back pain. Summary  Back pain may be caused by several factors that are related to changes during your pregnancy.  Follow instructions as told by your health care provider for managing pain, stiffness, and swelling.  Exercise as told by your health care provider. Gentle exercise is the best way to prevent or manage back pain.  Take over-the-counter and prescription  medicines only as told by your health care provider.  Keep all follow-up visits as told by your health care provider. This is important. This information is not intended to replace advice given to you by your health care provider. Make sure you discuss any questions you have with your health care provider. Document Released: 04/11/2005 Document Revised: 04/22/2018 Document Reviewed: 06/19/2017 Elsevier Patient Education  2020 [employer] Round Ligament Pain  The round ligament is a cord of muscle and tissue that helps support the uterus. It can become a source of pain during pregnancy if it becomes stretched or twisted as the baby grows. The pain usually begins in the second trimester (13-28 weeks) of pregnancy, and it can come and go until the baby is delivered. It is not a serious problem, and it does not cause harm to the baby. Round ligament pain is usually a short, sharp, and pinching pain, but it can also be a dull, lingering, and aching pain. The pain is felt in the lower side of the abdomen or in the groin. It usually starts deep in the groin and moves up to the outside of the hip area. The pain may occur when you:  Suddenly change position, such as quickly going from a sitting to standing position.  Roll over in bed.  Cough or sneeze.  Do physical activity. Follow these instructions at home:   Watch your condition for any changes.  When the pain starts, relax. Then try any of these methods to help with the pain: ? Sitting down. ? Flexing your knees up to your abdomen. ? Lying on your side with one pillow under your abdomen and another pillow between your legs. ? Sitting in a warm bath for 15-20 minutes or until the pain goes away.  Take over-the-counter and prescription medicines only as told by your health care provider.  Move slowly when you sit down or stand up.  Avoid long walks if they cause pain.  Stop or reduce your physical activities if they cause pain.  Keep  all follow-up visits as told by your health care provider. This is important. Contact a health care provider if:  Your pain does not go away with treatment.  You feel pain in your back that you did not have before.  Your medicine is not helping. Get help right away if:  You have a fever or chills.  You develop uterine contractions.  You have vaginal bleeding.  You have nausea or vomiting.  You have diarrhea.  You have pain when you urinate. Summary  Round ligament pain is felt in the lower abdomen or groin. It is usually a short, sharp, and pinching pain. It can also be a dull, lingering, and aching pain.  This pain usually begins in the second trimester (13-28 weeks). It occurs because the uterus is stretching with the growing baby, and it is not harmful to   the baby.  You may notice the pain when you suddenly change position, when you cough or sneeze, or during physical activity.  Relaxing, flexing your knees to your abdomen, lying on one side, or taking a warm bath may help to get rid of the pain.  Get help from your health care provider if the pain does not go away or if you have vaginal bleeding, nausea, vomiting, diarrhea, or painful urination. This information is not intended to replace advice given to you by your health care provider. Make sure you discuss any questions you have with your health care provider. Document Released: 10/11/2007 Document Revised: 06/19/2017 Document Reviewed: 06/19/2017 Elsevier Patient Education  2020 Elsevier Inc.  

## 2018-10-30 NOTE — Progress Notes (Signed)
ROB-Reports intermittent lower pelvic pain/cramping for the last two (2) weeks and frequent urination with the inability to "hold". Discussed home treatment measures; referral to Pelvic Floor PT, see orders. Early glucose completed. Anticipatory guidance regarding course of prenatal care. Reviewed red flag symptoms and when to call. RTC x 4-5 weeks for ANATOMY SCAN and ROB or sooner if needed.

## 2018-10-31 LAB — GLUCOSE, 1 HOUR GESTATIONAL: Gestational Diabetes Screen: 126 mg/dL (ref 65–139)

## 2018-11-11 ENCOUNTER — Ambulatory Visit: Payer: 59 | Attending: Certified Nurse Midwife

## 2018-11-11 ENCOUNTER — Other Ambulatory Visit: Payer: Self-pay

## 2018-11-11 DIAGNOSIS — M62838 Other muscle spasm: Secondary | ICD-10-CM | POA: Diagnosis present

## 2018-11-11 DIAGNOSIS — N3946 Mixed incontinence: Secondary | ICD-10-CM | POA: Insufficient documentation

## 2018-11-11 DIAGNOSIS — R293 Abnormal posture: Secondary | ICD-10-CM | POA: Insufficient documentation

## 2018-11-11 NOTE — Patient Instructions (Signed)
1. Stabilization: Diaphragmatic Breathing    Lie with knees bent, feet flat. Place one hand on stomach, other on chest. Breathe deeply through nose, lifting belly hand without any motion of hand on chest. As you exhale, flatten your back towards the chair or bed.  Repeat __10__ times per set. Do __3__ sets per session. Do _7_sessions per week.  **practice before you go to the bathroom when you initially feel the urge to go to the bathroom.    2. Pelvic Tilt With Pelvic Floor (Hook-Lying)        Lie with hips and knees bent. Squeeze pelvic floor and flatten low back while breathing out so that pelvis tilts. Repeat _10__ times. Do __2_ times a day.  Put a pillow under your hips in this position when doing this exercise to help decrease pressure in the pelvis when it feels "heavy".    3. Getting In/out of bed     Lying on back, bend left knee and place left arm across chest. Roll all in one movement to the right. Reverse to roll to the left. Always move as one unit.     Once you are lying on you side, move legs to edge of bed. Pull in the pelvic floor and lower tummy and push down with both hands while moving legs off bed to reach sitting position.  *Reverse sequence to return to lying down.  Copyright  VHI. All rights reserved.     Copyright  VHI. All rights reserved.

## 2018-11-11 NOTE — Therapy (Addendum)
[facility] [address] [place], [place], [address] Phone: [phone]   Fax:  [phone]  Physical Therapy Evaluation The patient has been informed of current processes in place at Outpatient Rehab to protect patients from Covid-19 exposure including social distancing, schedule modifications, and new cleaning procedures. After discussing their particular risk with a therapist based on the patient's personal risk factors, the patient has decided to proceed with in-person therapy.  Patient Details  Name: Tracy Stevens MRN: [id number] Date of Birth: [birth year] No data recorded  Encounter Date: 11/11/2018  PT End of Session - 11/11/18 1642    Visit Number  1    Number of Visits  10    Date for PT Re-Evaluation  01/20/19    Authorization Time Period  through 01/19/2018    Authorization - Visit Number  1    Authorization - Number of Visits  10    PT Start Time  1230    PT Stop Time  1330    PT Time Calculation (min)  60 min    Activity Tolerance  Patient tolerated treatment well;No increased pain    Behavior During Therapy  WFL for tasks assessed/performed       Past Medical History:  Diagnosis Date  . Congenital enlarged kidney   . Headache     Past Surgical History:  Procedure Laterality Date  . NASAL SINUS SURGERY      There were no vitals filed for this visit.    Pelvic Floor Physical Therapy Evaluation and Assessment  SCREENING  Falls in last 6 mo: No  Interpreter Needed?: No  Interpreter Agency:  Interpreter Name  Interpreter ID  Patient Declined Interpreter   Patient signed [place] waiver    Patient's communication preference:   Red Flags:  Have you had any night sweats? Yes- stays hot; before pregnancy  Unexplained weight loss? No  Saddle anesthesia? No   Unexplained changes in bowel or bladder habits? No   SUBJECTIVE  Patient reports: Patient reports that since she has been pregnant  she has had a very low appetite, very nauseas and has lot weight. In addition she has had severe pressure over her bladder that makes her think she has to go to the bathroom "all the time". . Used to not have to use the bathroom more than 3-4x/day, now she feels like she is going every hour. And feels like she hasn't completely emptied her bladder when she does go. After using the restroom she feels the pain or extra pressure and that she did not completely void. Awakens her from sleep- which is abnormal. Wakes up and feels a lot of pain and feels like shes going to pee her pants when she wakes up. This has caused increased stress at work as she is scared she may get to the point while in a patients room when she cannot make it to the bathroom in time. 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 Patient also reports that prior to pregnancy she had LBP, and now her LBP symptoms has changed to when the pain increases it takes her breath away. Her symptoms are limiting her ability to walk for longer periods of time. She reports that she newly has a "little leakage" with coughing only.  She is having some lower pelvic pain- not to the point "where I can't take it".   Other important information shared:  -  Last ultrasound she had the baby was sitting rather low, and that caused a lot of increased suprapubic pressure during the ultrasound.  -Has a personal history of enlarged kidneys as a child  - She reports that no special accommodations can be made at work.  - In the last year she had a R bakers cyst limiting wrist ROM and  B lower LE pain while at work (wears Crocs- only thing that helps)  - Patient is unable to wear a CPAP at night because it makes her ill (vomit). Newly diagnosed with sleep apnea, and sleeps propped up now.      Precautions:  [redacted] weeks pregnant  CPAP   Social/Family/Vocational History:   CNA at [employer]   Recent  Procedures/Tests/Findings:  No   Obstetrical History: G1P0   Gynecological History: Second trimester pregnancy   Urinary History: Urge and stress related incontinence; suprapubic pain   Gastrointestinal History: Bowel movements have been normal.   Sexual activity/pain: No pain with sex.   Location of pain: LBP (previously on muscle relaxer) and suprapubic pain  Current pain:  0/10 just discomfort  Max pain:  5-6/10 (lots of pressure suprapubic); 9-10/10 (LBP- sharp pains after pregancy it started)  Least pain:  0/10 Nature of pain: LBP- sharp, takes her breath away. Suprapubic- pressure   Patient Goals: *Be able to not feel this pressure, and it messes her up at work and daily activities   OBJECTIVE  Posture/Observations:  Sitting: anterior pelvic tilt, rounded shoulders  Standing: anterior pelvic tilt   Palpation/Segmental Motion/Joint Play: R QL  R L5 paraspinals  B illiacus   Special tests:   Supine-to-long-sit: R leg slightly longer   Range of Motion/Flexibilty:  Spine: approx 45% of B thoracic rotation.  Hips:    Strength/MMT: deferred to follow up  LE MMT  LE MMT Left Right  Hip flex:  (L2) /5 /5  Hip ext: /5 /5  Hip abd: /5 /5  Hip add: /5 /5  Hip IR /5 /5  Hip ER /5 /5     Abdominal:  Palpation: Diastasis: not noted this session.   Pelvic Floor-- Deferred to follow up  External Exam: Introitus Appears:  Skin integrity:  Palpation: Cough: Prolapse visible?: Scar mobility:  Internal Vaginal Exam: Strength (PERF):  Symmetry: Palpation: Prolapse:   Internal Rectal Exam: Strength (PERF): Symmetry: Palpation: Prolapse:   Gait Analysis: anterior pelvic tilt; wide BOS    Pelvic Floor Outcome Measures: 81/300 (57/100 UIQ; 0/100 on bowel/rectum; 24/100 on POPIQ)   INTERVENTIONS THIS SESSION: Self Care: Educated on the structure and function of the pelvic floor in relation to their symptoms as well as the POC, and initial HEP in  order to set patient expectations and understanding from which we will build on in the future sessions. Including soda-can theory and part 1 of urge suppression techniques.  (15 minutes)   ThereAct; Diaphragmatic breathing with and without pelvic tilts in hook-lying for increased coordination between diaphragm and PFM as well as decreased LBP. Education and performance of log rolling and sit<>stand with breathing coordination for decreased intraabdominal pressure with functional mobility. Urge suppression technique- part 1- diaphragmatic breathing prior to using the restroom to assist in decreasing urge. Patient experienced decreased urgency while in session after diaphragmatic breathing and patient education. (25 minutes)   Total time: 60 minutes                  Objective measurements completed on examination: See above findings.  PT Short Term Goals - 11/11/18 1653      PT SHORT TERM GOAL #1   Title  Patient will demonstrate functional recruitment of TA with breathing, sit-to-stand, squatting/lifting, and walking to allow for improved pelvic brace coordination, improved balance, and decreased downward pressure on the pelvic organs.    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    Time  5    Period  Weeks    Status  New    Target Date  12/16/18      PT SHORT TERM GOAL #2   Title  Patient will report a reduction in LBP pain to no greater than 5/10 over the prior week to demonstrate symptom improvement.    Baseline  10-27 10/10 LBP when it spasms    Time  5    Period  Weeks    Status  New    Target Date  12/16/18      PT SHORT TERM GOAL #3   Title  Patient will report a reduction in suprapubic pressure/ pain to no greater than 2/10 over the prior week to demonstrate symptom improvement.    Baseline  10-27 suprapubic pain 5/10 NPS with bladder related symptoms.    Time  5    Period  Weeks    Status   New    Target Date  12/16/18        PT Long Term Goals - 11/11/18 1236      PT LONG TERM GOAL #1   Title  Patient will score less than or equal to 20% on the Female NIH-CPSI to demonstrate a reduction in pain, urinary symptoms, and an improved quality of life.    Baseline  10-27 22/43 on Female NIH (52%)    Time  10    Period  Weeks    Status  New    Target Date  01/20/19      PT LONG TERM GOAL #2   Title  Patient will score at or below 7/300 on the PFIQ to demonstrate a clinically meaningful decrease in disability and distress due to pelvic floor dysfunction.    Baseline  17/300 (12/100 UDI; 0/100 on bowel/rectum; 5/100 pelvic pain) at eval.    Time  10    Period  Weeks    Status  New    Target Date  01/20/19      PT LONG TERM GOAL #3   Title  Patient will report urinating 6-8 times per day over the course of the prior week to demonstrate decreased frequency.    Baseline  10-27: voiding 8-10x/ day; with sensation of pressure an incomplete voiding.    Time  10    Period  Weeks    Status  New    Target Date  01/20/19             Plan - 11/11/18 1642    Clinical Impression Statement  Patient is a 23 y.o female who is [redacted] weeks gestation. Patient arrived to pelvic health PT with CC of pelvic pain, LBP, increased urinary frequency and stress urinary incontience. Patient has a releavant PMH of asthma/allergies and headaches. Patient has a relevant family medical history of mother had 3 strokes and has MS, and maternal grandmother had ALS. Patient reports decreased apetite with associated weight loss due to nausea and vomitting continuing into second trimester of pregancy. Patient will benefit from skilled pelvic health physical therapy to address patients goals, imrpove pelvic  alignment, decreased patients symptoms and decrease liklihood of recurrent symptoms.    Personal Factors and Comorbidities  Profession;Comorbidity 1    Comorbidities  BMI;    Examination-Activity  Limitations  Bed Mobility;Carry;Lift    Stability/Clinical Decision Making  Evolving/Moderate complexity    Clinical Decision Making  Low    Rehab Potential  Good    PT Frequency  1x / week    PT Duration  Other (comment)   10 weeks   PT Treatment/Interventions  ADLs/Self Care Home Management;Biofeedback;Cryotherapy;Moist Heat;Gait training;Stair training;Functional mobility training;Therapeutic activities;Therapeutic exercise;Balance training;Neuromuscular re-education;Patient/family education;Energy conservation;Joint Manipulations;Spinal Manipulations    PT Next Visit Plan  review below HEP - progress TA activation; assess external PFM coordination; address TTPs    PT Home Exercise Plan  log rolling; STS; diaphgramatic breathing (part 1 urge supression and pelvic tilts with breathing).    Consulted and Agree with Plan of Care  Patient       Patient will benefit from skilled therapeutic intervention in order to improve the following deficits and impairments:  Abnormal gait, Decreased balance, Decreased activity tolerance, Decreased coordination, Decreased endurance, Decreased mobility, Decreased range of motion, Decreased strength, Difficulty walking, Increased fascial restricitons, Increased muscle spasms, Pain, Postural dysfunction  Visit Diagnosis: Other muscle spasm  Abnormal posture  Mixed incontinence     Problem List Patient Active Problem List   Diagnosis Date Noted  . Pelvic pain affecting pregnancy in second trimester, antepartum 10/30/2018  . Urge incontinence of urine 10/30/2018  . Maternal varicella, non-immune 10/02/2018  . Low back pain 09/30/2018  . BMI 35.0-35.9,adult 09/30/2018  . Family history of breast cancer 09/30/2018  . Intractable migraine with aura with status migrainosus 11/08/2015    Tracy Stevens, SPT  11/12/2018, 8:10 AM  [place] MAIN [facility] SERVICES [address] [place], [place], [address] Phone:  [phone]   Fax:  [phone]  Name: Tracy Stevens MRN: [id number] Date of Birth: [birth year]

## 2018-11-17 ENCOUNTER — Other Ambulatory Visit: Payer: Self-pay

## 2018-11-17 ENCOUNTER — Ambulatory Visit: Payer: 59 | Attending: Certified Nurse Midwife

## 2018-11-17 DIAGNOSIS — M62838 Other muscle spasm: Secondary | ICD-10-CM | POA: Insufficient documentation

## 2018-11-17 DIAGNOSIS — R293 Abnormal posture: Secondary | ICD-10-CM | POA: Diagnosis present

## 2018-11-17 DIAGNOSIS — N3946 Mixed incontinence: Secondary | ICD-10-CM | POA: Diagnosis present

## 2018-11-17 NOTE — Therapy (Signed)
[facility] MAIN [facility] SERVICES [address] [place], [place], [address] Phone: [phone]   Fax:  [phone]  Physical Therapy Treatment The patient has been informed of current processes in place at Outpatient Rehab to protect patients from Covid-19 exposure including social distancing, schedule modifications, and new cleaning procedures. After discussing their particular risk with a therapist based on the patient's personal risk factors, the patient has decided to proceed with in-person therapy.  Patient Details  Name: Tracy Stevens MRN: [id number] Date of Birth: 1995-11-25 No data recorded  Encounter Date: 11/17/2018  PT End of Session - 11/17/18 1702    Visit Number  2    Number of Visits  10    Date for PT Re-Evaluation  01/20/19    Authorization Time Period  through 01/19/2018    Authorization - Visit Number  2    Authorization - Number of Visits  10    PT Start Time  1430    PT Stop Time  1530    PT Time Calculation (min)  60 min    Activity Tolerance  Patient tolerated treatment well;No increased pain    Behavior During Therapy  WFL for tasks assessed/performed       Past Medical History:  Diagnosis Date  . Congenital enlarged kidney   . Headache     Past Surgical History:  Procedure Laterality Date  . NASAL SINUS SURGERY      There were no vitals filed for this visit.    Pelvic Floor Physical Therapy Treatment Note  SCREENING  Changes in medications, allergies, or medical history?: Yes temporary - severe sinus tylenol due to allergy congestion     SUBJECTIVE  Patient reports: Belly breathing has been getting easier. Had a week of HUC (lower level activities) and now will have 2 weeks of shifts on the floor. Ankles are starting swell. Allergies are getting worse and she started to have vomiting (possibly reflux) when she is standing up and walking right after eating. Sleeping update: Woke up every hour- and this is  new.   * Loosing weight still. Has been able to keep down a brownie and Ramen noodles. 192lbs from 208lbs-  18 weeks ago.   Work has been getting easier due to belly breathing reducing urge related incontinence.   Precautions:  [redacted] weeks pregnant   Pain update:  Location of pain: No LBP this session; suprapubic pressure  Current pain:  3/10  Max pain:  5-6/10 only when she has to go to the bathroom  Least pain:  1-2/10 Nature of pain: deep pressure   - no LBP at EOS; reduced to 0/10 on NPS suprapubic pressure.    Patient Goals: *Be able to not feel this pressure, and it messes her up at work and daily activities   OBJECTIVE  Changes in: Posture/Observations:  Anterior pelvic tilt in sitting and standing   Range of Motion/Flexibilty:  Decreased B thoracic ROM   Strength/MMT:  LE MMT:  Pelvic floor: External PFM - paradoxical contraction with diaphragmatic   Abdominal:  Increased fascial tension at lower central abdomen near bladder  Palpation: TTP at B paraspinals L1-5  TTP at B illiacus  TTP at B psoas  TTP at L adductors  Hypomobility along thoracic/lumbar joints, most significant at T/L junction   Gait Analysis: Not assessed this session   INTERVENTIONS THIS SESSION: Manual Treatment  --Manual trigger point release at paraspinals L1-5, B illiacus, psoas and L adductor to to  decrease spasm and pain and allow for improved balance of musculature for improved function and decreased symptoms. -- MWM (bow and arrow, bilaterally) with grade II/III PA thoracic mobs along T9-12 to increase thoracic rotation and improve mobility- possibly connected to increased reflux.  (25 minutes)   TherEx  - Bow and arrow performed to increased thoracic mobility, reduce abdominal tension to help with alleviation of symptoms (added to HEP)  - seated and hook-lying pelvic tilts, with tactile and verbal external PFM cues for correct PFM contraction engagement (Kegel). Most effective  cues was "nodding the clitoris". Decreased endurance of PFM contraction noted. (20 minutes)   Self Care  - Urge and stress UI suppression techniques taught this session. Initially requiring verbal cues for sequencing.  - Pregnancy related posture and sleeping hygiene performed (15 minutes)   Total time: 60 minutes                      PT Short Term Goals - 11/11/18 1653      PT SHORT TERM GOAL #1   Title  Patient will demonstrate functional recruitment of TA with breathing, sit-to-stand, squatting/lifting, and walking to allow for improved pelvic brace coordination, improved balance, and decreased downward pressure on the pelvic organs.    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    Time  5    Period  Weeks    Status  New    Target Date  12/16/18      PT SHORT TERM GOAL #2   Title  Patient will report a reduction in LBP pain to no greater than 5/10 over the prior week to demonstrate symptom improvement.    Baseline  10-27 10/10 LBP when it spasms    Time  5    Period  Weeks    Status  New    Target Date  12/16/18      PT SHORT TERM GOAL #3   Title  Patient will report a reduction in suprapubic pressure/ pain to no greater than 2/10 over the prior week to demonstrate symptom improvement.    Baseline  10-27 suprapubic pain 5/10 NPS with bladder related symptoms.    Time  5    Period  Weeks    Status  New    Target Date  12/16/18        PT Long Term Goals - 11/11/18 1236      PT LONG TERM GOAL #1   Title  Patient will score less than or equal to 20% on the Female NIH-CPSI to demonstrate a reduction in pain, urinary symptoms, and an improved quality of life.    Baseline  10-27 22/43 on Female NIH (52%)    Time  10    Period  Weeks    Status  New    Target Date  01/20/19      PT LONG TERM GOAL #2   Title  Patient will score at or below 7/300 on the PFIQ to demonstrate a clinically meaningful decrease in  disability and distress due to pelvic floor dysfunction.    Baseline  81/300 (57/100 UIQ; 0/100 on bowel/rectum; 24/100 on POPIQ) at eval.    Time  10    Period  Weeks    Status  New    Target Date  01/20/19      PT LONG TERM GOAL #3   Title  Patient will report urinating 6-8 times per day over the  course of the prior week to demonstrate decreased frequency.    Baseline  10-27: voiding 8-10x/ day; with sensation of pressure an incomplete voiding.    Time  10    Period  Weeks    Status  New    Target Date  01/20/19            Plan - 11/17/18 1715    Clinical Impression Statement  Pt. Responded well to all interventions today, demonstrating improved pelvic tilt and diaphragmatic breathing, decreased muscle spasms and pain,  as well as understanding and correct performance of all education and exercises provided today. They will continue to benefit from skilled physical therapy to work toward remaining goals and maximize function as well as decrease likelihood of symptom increase or recurrence.    Personal Factors and Comorbidities  Profession;Comorbidity 1    Comorbidities  BMI;    Examination-Activity Limitations  Bed Mobility;Carry;Lift    Stability/Clinical Decision Making  Stable/Uncomplicated    Rehab Potential  Good    PT Frequency  1x / week    PT Duration  Other (comment)   10 weeks   PT Treatment/Interventions  ADLs/Self Care Home Management;Biofeedback;Cryotherapy;Moist Heat;Gait training;Stair training;Functional mobility training;Therapeutic activities;Therapeutic exercise;Balance training;Neuromuscular re-education;Patient/family education;Energy conservation;Joint Manipulations;Spinal Manipulations    PT Next Visit Plan  progress TA activation; address TTPs; possible internal PFM assessment; improved muscle coordination (hook-lying and seated, attempt standing)    PT Home Exercise Plan  log rolling; STS; diaphgramatic breathing; pelvic tilts; urge suppression  techniques; pre-sqeeze and sneeze techniques, bow and arrow    Consulted and Agree with Plan of Care  Patient       Patient will benefit from skilled therapeutic intervention in order to improve the following deficits and impairments:  Abnormal gait, Decreased balance, Decreased activity tolerance, Decreased coordination, Decreased endurance, Decreased mobility, Decreased range of motion, Decreased strength, Difficulty walking, Increased fascial restricitons, Increased muscle spasms, Pain, Postural dysfunction  Visit Diagnosis: Other muscle spasm  Abnormal posture  Mixed incontinence     Problem List Patient Active Problem List   Diagnosis Date Noted  . Pelvic pain affecting pregnancy in second trimester, antepartum 10/30/2018  . Urge incontinence of urine 10/30/2018  . Maternal varicella, non-immune 10/02/2018  . Low back pain 09/30/2018  . BMI 35.0-35.9,adult 09/30/2018  . Family history of breast cancer 09/30/2018  . Intractable migraine with aura with status migrainosus 11/08/2015   Baelynn Schmuhl, SPT  Keimani Laufer 11/17/2018, 5:28 PM  [place] [facility] [address] [place], [place], [address] Phone: [phone]   Fax:  [phone]  Name: Glynda JaegerShianne T Frye MRN: [id number] Date of Birth: 1995-05-21

## 2018-11-17 NOTE — Patient Instructions (Addendum)
Exercise 1a. Pelvic Tilt With Pelvic Floor (Hook-Lying)        Lie with hips and knees bent. Squeeze pelvic floor and flatten low back while breathing out so that pelvis tilts. Repeat _10__ times. Do _2__ times a day. As you breath out draw your pelvic floor muscles up and in (like you're nodding your clitoris)   Put a pillow under your hips in this position when doing this exercise to help decrease pressure in the pelvis when it feels "heavy".  Exercise 1b: (good for busy days or at work)     Do 2 sets of 10 tilts per day. Breathe in when you tilt forward (A) and out when you tuck under (B).  Urge supression technique: (for when you feel like you have to go to the bathroom very badly)   1) Take a deep breath to convince yourself that you are in control and calm the nervous system.  2) Do 5 "quick-flick" kegels (pelvic floor muscle contractions) and re-assess the urge. Repeat another set if urge is still present. 3) Once the urge has decreased, start walking calmly to the the bathroom. Stop and repeat steps 1 and 2 as many times as needed until you can successfully get to the toilet. 4) Only once seated, take a deep breath and allow the pelvic floor muscles to relax and allow for the urine to flow.    Do not be discouraged if you are not successful the first couple times, this is normal and it will take practice but remember that YOU are in control. Start by practicing this at home where you do not have to worry as much if there were to be an accident. Allowing yourself to get rushed or nervous puts the bladder back in control and will not allow the technique to work.  Sleeping Strategies:  A Few Pillows: Good sleep is crucial, try these positioning tricks to decrease strain on your changing body.  . Make sure that your head pillow is thick enough to support your head in line with your spine. . Use a thin or wedge-shaped pillow under the belly to decrease the strain of extra weight acting  on your spine. . Place a pillow between your knees to take pressure off your hips and low back. Marland Kitchen Keep your knees slightly bent to keep your back in neutral alignment.    "Pre-squeeze and sneeze"  Before you cough, sneeze, laugh etc. "pre-squeeze" the pelvic floor (kegel) and hold it until you finish coughing to retrain the muscles to hold the urine in during these activities.   Posture and Pregnancy: (just good info to be conscious of)      Exercise 2:     Let the top arm rest on your side, and slide along the torso as you rotate. Breathe in as you come forward, out as you open up. Do 2x10 on each side. Do an extra set to the "tighter side"

## 2018-11-24 ENCOUNTER — Ambulatory Visit: Payer: 59

## 2018-11-24 ENCOUNTER — Other Ambulatory Visit: Payer: Self-pay

## 2018-11-24 DIAGNOSIS — M62838 Other muscle spasm: Secondary | ICD-10-CM

## 2018-11-24 DIAGNOSIS — R293 Abnormal posture: Secondary | ICD-10-CM

## 2018-11-24 DIAGNOSIS — N3946 Mixed incontinence: Secondary | ICD-10-CM

## 2018-11-24 NOTE — Therapy (Signed)
[facility] MAIN [facility] SERVICES [address] [place], [place], [address] Phone: [phone]   Fax:  [phone]  Physical Therapy Treatment The patient has been informed of current processes in place at Outpatient Rehab to protect patients from Covid-19 exposure including social distancing, schedule modifications, and new cleaning procedures. After discussing their particular risk with a therapist based on the patient's personal risk factors, the patient has decided to proceed with in-person therapy.  Patient Details  Name: Tracy Stevens MRN: [id number] Date of Birth: 08/01/1995 No data recorded  Encounter Date: 11/24/2018  PT End of Session - 11/24/18 1704    Visit Number  3    Number of Visits  10    Date for PT Re-Evaluation  01/20/19    Authorization Time Period  through 01/19/2018    Authorization - Visit Number  3    Authorization - Number of Visits  10    PT Start Time  1430    PT Stop Time  1530    PT Time Calculation (min)  60 min    Activity Tolerance  Patient tolerated treatment well;No increased pain    Behavior During Therapy  WFL for tasks assessed/performed       Past Medical History:  Diagnosis Date  . Congenital enlarged kidney   . Headache     Past Surgical History:  Procedure Laterality Date  . NASAL SINUS SURGERY      There were no vitals filed for this visit.   Pelvic Floor Physical Therapy Treatment Note  SCREENING  Changes in medications, allergies, or medical history?: Yes temporary - severe sinus tylenol due to allergy congestion     SUBJECTIVE  Patient reports: Has been able to eat and keep the food down today and this weekend. Has been having less urgency. Work has been getting easier due to belly breathing reducing urge related incontinence. Sleep has been better. 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 Has urinated 3-4x today, and has been up since 6am. Probably going about  10x a day. Got very dehydrated the other day- heart raced. Had not been drinking water at work, and now she is drinking more fluids during the day. Has a little bit of LBP "but it is normal for me pre-pregnancy". Reports discomfort with "really deep" inhalation.  Precautions:  [redacted] weeks pregnant   Pain update:  Location of pain: suprapubic pressure/pain, LBP  Current pain:  ?/10 (a little bit)  Max pain:  8-9/10 (one time incident)  Least pain:  1-2/10 Nature of pain: cramp; muscle  -- took half a day for pain at suprapubic region - felt like a muscle.  -- performed exercises throughout the day- "kept moving"  -- no LBP - with inhalation- pressure at T/L junction   - no LBP at EOS; reduced to 0/10 on NPS suprapubic pressure.    Patient Goals: *Be able to not feel this pressure, and it messes her up at work and daily activities   OBJECTIVE  Changes in: Posture/Observations:  Anterior pelvic tilt in sitting and standing   Range of Motion/Flexibilty:  Decreased B thoracic ROM   Strength/MMT:  LE MMT:  Pelvic floor: External PFM - paradoxical contraction with diaphragmatic breathing  Abdominal:  Increased fascial tension at lower central abdomen near bladder   Palpation: TTP along T10-L2 paraspinals  Hypomobility along thoracic/lumbar joints, most significant at T/L junction   Gait Analysis: Not assessed this session   INTERVENTIONS THIS  SESSION: Manual Treatment  Manual B myofascial release along T10-L2 L>R, decreased fascial tension following, grade II/III PA mobs along T10-L2, no pain upon inspiration following PA mobs. B QL release for decrease tenderness along hips and lumbar spine. (24 minutes)   TherEx Modquad and quadruped TA engagement with breathing coordination and posterior pelvic tilt. Improved proprioception and performance with quadruped. (8 minutes)   ThereAct Work lifting and bending education and performance to decrease symptoms and reduce risk of  injury. Patient demonstrated narrow BOS and valsalva maneuver when lifting patients during simulation, patient demonstrated improved BOS, decreased back extensor involvement, and breathing coordination following training- no symptoms (urgency or LBP) following today's treatment. (20 minutes)   Self Care: Water hygiene and pregnancy support education performed this date for improved management of urinary symptoms and decreased risk of LBP with pregnancy progression (increased TA engagement cues). (8 minutes)   Total time: 60 minutes (2:30-3:30)                                     PT Short Term Goals - 11/11/18 1653      PT SHORT TERM GOAL #1   Title  Patient will demonstrate functional recruitment of TA with breathing, sit-to-stand, squatting/lifting, and walking to allow for improved pelvic brace coordination, improved balance, and decreased downward pressure on the pelvic organs.    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    Time  5    Period  Weeks    Status  New    Target Date  12/16/18      PT SHORT TERM GOAL #2   Title  Patient will report a reduction in LBP pain to no greater than 5/10 over the prior week to demonstrate symptom improvement.    Baseline  10-27 10/10 LBP when it spasms    Time  5    Period  Weeks    Status  New    Target Date  12/16/18      PT SHORT TERM GOAL #3   Title  Patient will report a reduction in suprapubic pressure/ pain to no greater than 2/10 over the prior week to demonstrate symptom improvement.    Baseline  10-27 suprapubic pain 5/10 NPS with bladder related symptoms.    Time  5    Period  Weeks    Status  New    Target Date  12/16/18        PT Long Term Goals - 11/11/18 1236      PT LONG TERM GOAL #1   Title  Patient will score less than or equal to 20% on the Female NIH-CPSI to demonstrate a reduction in pain, urinary symptoms, and an improved quality of  life.    Baseline  10-27 22/43 on Female NIH (52%)    Time  10    Period  Weeks    Status  New    Target Date  01/20/19      PT LONG TERM GOAL #2   Title  Patient will score at or below 7/300 on the PFIQ to demonstrate a clinically meaningful decrease in disability and distress due to pelvic floor dysfunction.    Baseline  81/300 (57/100 UIQ; 0/100 on bowel/rectum; 24/100 on POPIQ) at eval.    Time  10    Period  Weeks    Status  New  Target Date  01/20/19      PT LONG TERM GOAL #3   Title  Patient will report urinating 6-8 times per day over the course of the prior week to demonstrate decreased frequency.    Baseline  10-27: voiding 8-10x/ day; with sensation of pressure an incomplete voiding.    Time  10    Period  Weeks    Status  New    Target Date  01/20/19            Plan - 11/24/18 1704    Clinical Impression Statement  Pt. Responded well to all interventions today, demonstrating improved posterior pelvic tilt with TA engagement, improved body mechanics with work activities, decreasing risk of injury and pain exacerbations,  as well as understanding and correct performance of all education and exercises provided today. They will continue to benefit from skilled physical therapy to work toward remaining goals and maximize function as well as decrease likelihood of symptom increase or recurrence.    Personal Factors and Comorbidities  Profession;Comorbidity 1    Comorbidities  BMI;    Examination-Activity Limitations  Bed Mobility;Carry;Lift    Stability/Clinical Decision Making  Stable/Uncomplicated    Rehab Potential  Good    PT Frequency  1x / week    PT Duration  Other (comment)   10 weeks   PT Treatment/Interventions  ADLs/Self Care Home Management;Biofeedback;Cryotherapy;Moist Heat;Gait training;Stair training;Functional mobility training;Therapeutic activities;Therapeutic exercise;Balance training;Neuromuscular re-education;Patient/family education;Energy  conservation;Joint Manipulations;Spinal Manipulations    PT Next Visit Plan  progress TA activation (from quadruped); address TTPs; possible internal PFM assessment; improved muscle coordination (hook-lying and seated, attempt standing); address rounded shoulders/ thoracic kyphosis    PT Home Exercise Plan  log rolling; STS; diaphgramatic breathing; pelvic tilts; urge suppression techniques; pre-sqeeze and sneeze techniques, bow and arrow; water intake; pregnancy belts; work Herbalistbody mechanics    Consulted and Agree with Plan of Care  Patient       Patient will benefit from skilled therapeutic intervention in order to improve the following deficits and impairments:  Abnormal gait, Decreased balance, Decreased activity tolerance, Decreased coordination, Decreased endurance, Decreased mobility, Decreased range of motion, Decreased strength, Difficulty walking, Increased fascial restricitons, Increased muscle spasms, Pain, Postural dysfunction  Visit Diagnosis: Other muscle spasm  Abnormal posture  Mixed incontinence     Problem List Patient Active Problem List   Diagnosis Date Noted  . Pelvic pain affecting pregnancy in second trimester, antepartum 10/30/2018  . Urge incontinence of urine 10/30/2018  . Maternal varicella, non-immune 10/02/2018  . Low back pain 09/30/2018  . BMI 35.0-35.9,adult 09/30/2018  . Family history of breast cancer 09/30/2018  . Intractable migraine with aura with status migrainosus 11/08/2015   Averyana Pillars, SPT  Ida Milbrath 11/24/2018, 5:08 PM  [facility] MAIN [facility] [address] , [place], [address] Phone: [phone]   Fax:  [phone]  Name: Tracy Stevens MRN: [id number] Date of Birth: 1995-09-29

## 2018-11-24 NOTE — Patient Instructions (Addendum)
Available in 32 oz, 1/2 gallon, or 1 gallon sizes, all colors...... To help you increase your water intake, try getting a reusable water bottle with markings to help you keep up with your hydration throughout the day. You should be drinking ~ 50% of your bodyweight in Oz. Of water each day or an average of ~ 64 Oz.  You can also add fruit or cucumbers to your water to make it more interesting and desirable. Cucumbers and honeydew are good choices because they are not acidic like citrus fruit and they add a refreshing flavor.    Pregnancy belts - look for not too much support and stretchy.   [place] and Fit Splint (maternity)    Midwife  - keep the yourself close to the heavy weight (or patient) you are carrying  - always keep feet facing the direction you're moving- about hip width apart - always raise the bed up to help your back  - engage the core, and lift on the exhale or breathing out   For rolling mechanics  - bend knees, and roll hips towards one side, then have the shoulder follow. Using their body weight to assist you.    Core activation     Breathe in, let belly relax down toward the floor and then breathe out, pulling the lower belly in toward the backbone.  Shoulders directly above the elbow or hands on an elevated table (or on the ground). Knees slightly bent if standing.   Repeat this __10_ times __2_ times per day

## 2018-11-26 ENCOUNTER — Encounter: Payer: Self-pay | Admitting: Certified Nurse Midwife

## 2018-11-26 ENCOUNTER — Ambulatory Visit (INDEPENDENT_AMBULATORY_CARE_PROVIDER_SITE_OTHER): Payer: Managed Care, Other (non HMO) | Admitting: Certified Nurse Midwife

## 2018-11-26 ENCOUNTER — Other Ambulatory Visit: Payer: Self-pay

## 2018-11-26 ENCOUNTER — Ambulatory Visit (INDEPENDENT_AMBULATORY_CARE_PROVIDER_SITE_OTHER): Payer: Managed Care, Other (non HMO)

## 2018-11-26 VITALS — BP 96/53 | HR 85 | Wt 190.3 lb

## 2018-11-26 DIAGNOSIS — Z3492 Encounter for supervision of normal pregnancy, unspecified, second trimester: Secondary | ICD-10-CM

## 2018-11-26 DIAGNOSIS — Z363 Encounter for antenatal screening for malformations: Secondary | ICD-10-CM | POA: Diagnosis not present

## 2018-11-26 DIAGNOSIS — Z3689 Encounter for other specified antenatal screening: Secondary | ICD-10-CM

## 2018-11-26 DIAGNOSIS — Z3A19 19 weeks gestation of pregnancy: Secondary | ICD-10-CM

## 2018-11-26 LAB — POCT URINALYSIS DIPSTICK OB
Bilirubin, UA: NEGATIVE
Blood, UA: NEGATIVE
Glucose, UA: NEGATIVE
Ketones, UA: NEGATIVE
Leukocytes, UA: NEGATIVE
Nitrite, UA: NEGATIVE
POC,PROTEIN,UA: NEGATIVE
Spec Grav, UA: 1.015 (ref 1.010–1.025)
Urobilinogen, UA: 0.2 E.U./dL
pH, UA: 6 (ref 5.0–8.0)

## 2018-11-26 NOTE — Patient Instructions (Signed)

## 2018-11-26 NOTE — Progress Notes (Signed)
ROB and u/s today for anatomy (see below) results reviewed. Discussed incomplete , pt to return 2 wks to complete. Discussed anterior placenta.PT verbalizes understanding. Discussed round ligament pain and normal musculoskeletal discomforts. 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 She agrees . Follow up 2 wks gor u/s 4 wks for ROB.    Philip Aspen, CNM   Patient Name: Tracy Stevens DOB: May 19, 1995 MRN: [id number] ULTRASOUND REPORT  Location: [facility] Date of Service: 11/26/2018   Indications:Anatomy Ultrasound Findings:  Tracy Stevens intrauterine pregnancy is visualized with FHR at 144 BPM. Biometrics give an (U/S) Gestational age of [redacted]w[redacted]d and an (U/S) EDD of 04/22/2019; this correlates with the clinically established Estimated Date of Delivery: 04/18/19  Fetal presentation is Breech.  EFW: 244 g  ( 9oz). Placenta: anterior. Grade: 1 AFI: subjectively normal.  Anatomic survey is incomplete for spine and normal; Gender - female.    Right Ovary is normal in appearance. Left Ovary is normal appearance. Survey of the adnexa demonstrates no adnexal masses. There is no free peritoneal fluid in the cul de sac.  Impression: 1. [redacted]w[redacted]d Viable Singleton Intrauterine pregnancy by U/S. 2. (U/S) EDD is consistent with Clinically established Estimated Date of Delivery: 04/18/19 . 3. Normal Anatomy Scan 4. Incomplete for spine Recommendations: 1.Clinical correlation with the patient's History and Physical Exam.   Jenine M. Albertine Grates    RDMS

## 2018-12-04 ENCOUNTER — Ambulatory Visit: Payer: 59

## 2018-12-04 ENCOUNTER — Other Ambulatory Visit: Payer: Self-pay

## 2018-12-04 DIAGNOSIS — M62838 Other muscle spasm: Secondary | ICD-10-CM | POA: Diagnosis not present

## 2018-12-04 DIAGNOSIS — N3946 Mixed incontinence: Secondary | ICD-10-CM

## 2018-12-04 DIAGNOSIS — R293 Abnormal posture: Secondary | ICD-10-CM

## 2018-12-04 NOTE — Therapy (Signed)
[facility] [address] [place], [place], [address] Phone: [phone]   Fax:  [phone]  Physical Therapy Treatment The patient has been informed of current processes in place at Outpatient Rehab to protect patients from Covid-19 exposure including social distancing, schedule modifications, and new cleaning procedures. After discussing their particular risk with a therapist based on the patient's personal risk factors, the patient has decided to proceed with in-person therapy.  Patient Details  Name: Tracy Stevens MRN: [id number] Date of Birth: [birth year] No data recorded  Encounter Date: 12/04/2018  PT End of Session - 12/04/18 1412    Visit Number  4    Number of Visits  10    Date for PT Re-Evaluation  01/20/19    Authorization Time Period  through 01/19/2018    Authorization - Visit Number  4    Authorization - Number of Visits  10    PT Start Time  1412    PT Stop Time  1512    PT Time Calculation (min)  60 min    Activity Tolerance  Patient tolerated treatment well;No increased pain    Behavior During Therapy  WFL for tasks assessed/performed       Past Medical History:  Diagnosis Date  . Congenital enlarged kidney   . Headache     Past Surgical History:  Procedure Laterality Date  . NASAL SINUS SURGERY      There were no vitals filed for this visit.    Pelvic Floor Physical Therapy Treatment Note  SCREENING  Changes in medications, allergies, or medical history?: Yes temporary - severe sinus tylenol due to allergy congestion     SUBJECTIVE  Patient reports: Had anatomy U/S this last week. Baby is sitting very low, "she's sitting down on my bladder". Pt shares she'll have to go back on the 25th because baby didn't want to cooperate at the anatomy ultrasound. Will get naseaus every "now and again". Has kept everything down for the last few weeks. Had been having sinus issues the last few weeks and  tested negative for COVID. Sometimes getting dizzy the physicians are saying its because of her sinus's. The frequency with urination has been "much better" (only about 8-10x). Shares she is a clumsy person, and has always been an off balanced person.    Precautions:  [redacted] weeks pregnant   Pain update:  Location of pain:  "feel pressure on my hips"  Current pain:  0/10  Max pain:  5/10   Least pain:  0/10 Nature of pain: cramp; muscle   no LBP at EOS   Patient Goals: *Be able to not feel this pressure, and it messes her up at work and daily activities   OBJECTIVE  Changes in: Posture/Observations:  Anterior pelvic tilt in sitting and standing  L ASIS higher in supine, L pubic symphysis higher than R in supine, reduced following manual corrections ASIS's even in supine at EOS.  Range of Motion/Flexibilty:  Decreased B thoracic ROM   Strength/MMT:  LE MMT:  Pelvic floor: External PFM - paradoxical contraction with diaphragmatic breathing  Abdominal:  Increased fascial tension at lower central abdomen near bladder   Palpation: TTP along R L5/S1 paraspinals; paraspinals at T12/L1  TTP along B glute med/max and piriformis  Hypomobility along thoracic/lumbar joints, most significant at T/L junction   Gait Analysis: Not assessed this session   INTERVENTIONS THIS SESSION: Manual Treatment TP release to R paraspinals at L5/S1, B  glute med/max; piriformis, paraspinals T/L junction. PA glides grade II/II at T/L junction to decrease spasm and pain and allow for improved balance of musculature for improved function and decreased symptoms. (37 minutes)   TherEx countertop planks on forearms (2 sets of 20 seconds) for TrA engagement and lower trap stability.  B clamshells with yellow theraband to strengthen glute med and reduce return/progression of symptoms. (15 minutes)   Self Care: Education on mouth guard during sleeping to reduce sleep apnea like symptoms. (8 minutes)   Total  time: 60 minutes     PT Short Term Goals - 11/11/18 1653      PT SHORT TERM GOAL #1   Title  Patient will demonstrate functional recruitment of TA with breathing, sit-to-stand, squatting/lifting, and walking to allow for improved pelvic brace coordination, improved balance, and decreased downward pressure on the pelvic organs.    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    Time  5    Period  Weeks    Status  New    Target Date  12/16/18      PT SHORT TERM GOAL #2   Title  Patient will report a reduction in LBP pain to no greater than 5/10 over the prior week to demonstrate symptom improvement.    Baseline  10-27 10/10 LBP when it spasms    Time  5    Period  Weeks    Status  New    Target Date  12/16/18      PT SHORT TERM GOAL #3   Title  Patient will report a reduction in suprapubic pressure/ pain to no greater than 2/10 over the prior week to demonstrate symptom improvement.    Baseline  10-27 suprapubic pain 5/10 NPS with bladder related symptoms.    Time  5    Period  Weeks    Status  New    Target Date  12/16/18        PT Long Term Goals - 11/11/18 1236      PT LONG TERM GOAL #1   Title  Patient will score less than or equal to 20% on the Female NIH-CPSI to demonstrate a reduction in pain, urinary symptoms, and an improved quality of life.    Baseline  10-27 22/43 on Female NIH (52%)    Time  10    Period  Weeks    Status  New    Target Date  01/20/19      PT LONG TERM GOAL #2   Title  Patient will score at or below 7/300 on the PFIQ to demonstrate a clinically meaningful decrease in disability and distress due to pelvic floor dysfunction.    Baseline  81/300 (57/100 UIQ; 0/100 on bowel/rectum; 24/100 on POPIQ) at eval.    Time  10    Period  Weeks    Status  New    Target Date  01/20/19      PT LONG TERM GOAL #3   Title  Patient will report urinating 6-8 times per day over the course of the prior week to  demonstrate decreased frequency.    Baseline  10-27: voiding 8-10x/ day; with sensation of pressure an incomplete voiding.    Time  10    Period  Weeks    Status  New    Target Date  01/20/19            Plan - 12/04/18 1412  Clinical Impression Statement  Pt. Responded well to all interventions today, demonstrating pain relief, and improved glute med/ transverse abdominus engagement as well as understanding and correct performance of all education and exercises provided today. They will continue to benefit from skilled physical therapy to work toward remaining goals and maximize function as well as decrease likelihood of symptom increase or recurrence.    Personal Factors and Comorbidities  Profession;Comorbidity 1    Comorbidities  BMI;    Examination-Activity Limitations  Bed Mobility;Carry;Lift    Stability/Clinical Decision Making  Stable/Uncomplicated    Rehab Potential  Good    PT Frequency  1x / week    PT Duration  Other (comment)   10 weeks   PT Treatment/Interventions  ADLs/Self Care Home Management;Biofeedback;Cryotherapy;Moist Heat;Gait training;Stair training;Functional mobility training;Therapeutic activities;Therapeutic exercise;Balance training;Neuromuscular re-education;Patient/family education;Energy conservation;Joint Manipulations;Spinal Manipulations    PT Next Visit Plan  progress TA activation (from quadruped); address TTPs; possible internal PFM assessment; improved muscle coordination (hook-lying and seated, attempt standing); address rounded shoulders/ thoracic kyphosis    PT Home Exercise Plan  log rolling; STS; diaphgramatic breathing; pelvic tilts; urge suppression techniques; pre-sqeeze and sneeze techniques, bow and arrow; water intake; pregnancy belts; work Herbalist with Plan of Care  Patient       Patient will benefit from skilled therapeutic intervention in order to improve the following deficits and impairments:   Abnormal gait, Decreased balance, Decreased activity tolerance, Decreased coordination, Decreased endurance, Decreased mobility, Decreased range of motion, Decreased strength, Difficulty walking, Increased fascial restricitons, Increased muscle spasms, Pain, Postural dysfunction  Visit Diagnosis: Other muscle spasm  Abnormal posture  Mixed incontinence     Problem List Patient Active Problem List   Diagnosis Date Noted  . Pelvic pain affecting pregnancy in second trimester, antepartum 10/30/2018  . Urge incontinence of urine 10/30/2018  . Maternal varicella, non-immune 10/02/2018  . Low back pain 09/30/2018  . BMI 35.0-35.9,adult 09/30/2018  . Family history of breast cancer 09/30/2018  . Intractable migraine with aura with status migrainosus 11/08/2015   Gilford Rile, SPT  Tracy Stevens 12/04/2018, 4:54 PM  [facility] [facility] MAIN [facility] SERVICES [address] [place], [place], [address] Phone: [phone]   Fax:  [phone]  Name: Tracy Stevens MRN: [id number] Date of Birth: 11-Apr-1995

## 2018-12-04 NOTE — Patient Instructions (Addendum)
Mouth guard for sleep apnea - go to a pharmacy to get the one you can mold with hot water. If it works then go to an Clinical biochemist.    Automotive engineer  2 sets of 10  As you breath out, lift the knee up, keeping ankles together. Watch to make sure abs stay engaged and hips stay stacked.     Counter top plank     Leaning forward on a counter top, tuck your hips under until you feel the low tummy muscles working and keep the tuck as you bring your hips forward until your shoulder, hip, knee, and ankle are all in a straight line. Hold for ~ 20 sec. Or until your muscles fatigue, before you lose your good form. Repeat until you have done a total of 1:30, in as long or short of holds as is necessary to keep good form.      Tuck your hips under, then keep the tuck as you lean forward so you feel a stretch through your low back. Careful to not lock knees- small bend.  Hold for 5 deep breaths, repeat 2-3 times, 1-2 times per day. Repeat with a gentle stretch to the sides and hold, for a mid-back/lateral side stretch.

## 2018-12-09 ENCOUNTER — Other Ambulatory Visit: Payer: Self-pay

## 2018-12-09 ENCOUNTER — Ambulatory Visit: Payer: 59

## 2018-12-09 DIAGNOSIS — R293 Abnormal posture: Secondary | ICD-10-CM

## 2018-12-09 DIAGNOSIS — M62838 Other muscle spasm: Secondary | ICD-10-CM | POA: Diagnosis not present

## 2018-12-09 DIAGNOSIS — N3946 Mixed incontinence: Secondary | ICD-10-CM

## 2018-12-09 NOTE — Therapy (Signed)
[place] [facility] MAIN [facility] SERVICES [address] [place], [place], [address] Phone: [phone]   Fax:  [phone]  Physical Therapy Treatment  The patient has been informed of current processes in place at Outpatient Rehab to protect patients from Covid-19 exposure including social distancing, schedule modifications, and new cleaning procedures. After discussing their particular risk with a therapist based on the patient's personal risk factors, the patient has decided to proceed with in-person therapy.   Patient Details  Name: Tracy Stevens MRN: [id number] Date of Birth: [birth year] No data recorded  Encounter Date: 12/09/2018  PT End of Session - 12/09/18 1544    Visit Number  5    Number of Visits  10    Date for PT Re-Evaluation  01/20/19    Authorization Time Period  through 01/19/2018    Authorization - Visit Number  5    Authorization - Number of Visits  10    PT Start Time  1438    PT Stop Time  1538    PT Time Calculation (min)  60 min    Activity Tolerance  Patient tolerated treatment well;No increased pain    Behavior During Therapy  WFL for tasks assessed/performed       Past Medical History:  Diagnosis Date  . Congenital enlarged kidney   . Headache     Past Surgical History:  Procedure Laterality Date  . NASAL SINUS SURGERY      There were no vitals filed for this visit.    Pelvic Floor Physical Therapy Treatment Note  SCREENING  Changes in medications, allergies, or medical history?: none  SUBJECTIVE  Patient reports: Had soreness through the lower abdomen/pubic symphysis (bruised feeling) the day after her last treatment and had some pain at the end of urination. The pain has come and gone with urination since then, has been doing exercises daily at night.   Precautions:  [redacted] weeks pregnant   Pain update:  Location of pain:  Lower abdomen and at urethra with urination Current pain:  0/10  Max pain:  5/10    Least pain:  0/10 Nature of pain: cramp; muscle spasm    Patient Goals: *Be able to not feel this pressure, and it messes her up at work and daily activities   OBJECTIVE  Changes in: Posture/Observations:  Anterior pelvic tilt in sitting and standing  Pubic symphysis level in supine.   Range of Motion/Flexibilty:  Decreased B thoracic ROM   Strength/MMT:  LE MMT:   Pelvic Floor External Exam: Introitus Appears: wnl Skin integrity: WNL Palpation: no TTP Cough: intact Prolapse visible?: none Scar mobility: N/A  Internal Vaginal Exam: Strength (PERF): 4/5 (pre-treatment) 2 sec, 2 times. Following treatment 5/5, 3 sec, 1 time.  Symmetry: L>R for TTP (resolved through major areas following treatment Palpation: TTP throughout on L>R Prolapse: none  Abdominal:  Increased fascial tension at lower central abdomen near bladder   Palpation: No TTP through adductors B  Gait Analysis: Not assessed this session   INTERVENTIONS THIS SESSION: Manual Treatment Assessed PFM and performed TP release to all PFM on L to decrease spasm and pain and allow for improved balance of musculature for improved function and decreased symptoms. Gave verbal and tactile cues to facilitate improved coordination of the PFM to down-regulate and allow for decreased pain and spasm.  TherEx Educated on and practiced seated pelvic tilts and anti-kegels to improve coordination and strength of muscles acting within and upon the pelvis for  decreased pain and spasm. Educated on and practiced seated low back stretch to improve posture and allow for decreased LBP at work.   Total time: 60 minutes                             PT Short Term Goals - 11/11/18 1653      PT SHORT TERM GOAL #1   Title  Patient will demonstrate functional recruitment of TA with breathing, sit-to-stand, squatting/lifting, and walking to allow for improved pelvic brace coordination, improved balance, and  decreased downward pressure on the pelvic organs.    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    Time  5    Period  Weeks    Status  New    Target Date  12/16/18      PT SHORT TERM GOAL #2   Title  Patient will report a reduction in LBP pain to no greater than 5/10 over the prior week to demonstrate symptom improvement.    Baseline  10-27 10/10 LBP when it spasms    Time  5    Period  Weeks    Status  New    Target Date  12/16/18      PT SHORT TERM GOAL #3   Title  Patient will report a reduction in suprapubic pressure/ pain to no greater than 2/10 over the prior week to demonstrate symptom improvement.    Baseline  10-27 suprapubic pain 5/10 NPS with bladder related symptoms.    Time  5    Period  Weeks    Status  New    Target Date  12/16/18        PT Long Term Goals - 11/11/18 1236      PT LONG TERM GOAL #1   Title  Patient will score less than or equal to 20% on the Female NIH-CPSI to demonstrate a reduction in pain, urinary symptoms, and an improved quality of life.    Baseline  10-27 22/43 on Female NIH (52%)    Time  10    Period  Weeks    Status  New    Target Date  01/20/19      PT LONG TERM GOAL #2   Title  Patient will score at or below 7/300 on the PFIQ to demonstrate a clinically meaningful decrease in disability and distress due to pelvic floor dysfunction.    Baseline  81/300 (57/100 UIQ; 0/100 on bowel/rectum; 24/100 on POPIQ) at eval.    Time  10    Period  Weeks    Status  New    Target Date  01/20/19      PT LONG TERM GOAL #3   Title  Patient will report urinating 6-8 times per day over the course of the prior week to demonstrate decreased frequency.    Baseline  10-27: voiding 8-10x/ day; with sensation of pressure an incomplete voiding.    Time  10    Period  Weeks    Status  New    Target Date  01/20/19            Plan - 12/09/18 1545    Clinical Impression Statement  Pt. Responded well  to all interventions today, demonstrating decreased spasms internally and improved coordination and recruitment of PFM and TA as well as understanding and correct performance of all education and exercises provided today. They will continue  to benefit from skilled physical therapy to work toward remaining goals and maximize function as well as decrease likelihood of symptom increase or recurrence.     Personal Factors and Comorbidities  Profession;Comorbidity 1    Comorbidities  BMI;    Examination-Activity Limitations  Bed Mobility;Carry;Lift    Stability/Clinical Decision Making  Stable/Uncomplicated    Rehab Potential  Good    PT Frequency  1x / week    PT Duration  Other (comment)   10 weeks   PT Treatment/Interventions  ADLs/Self Care Home Management;Biofeedback;Cryotherapy;Moist Heat;Gait training;Stair training;Functional mobility training;Therapeutic activities;Therapeutic exercise;Balance training;Neuromuscular re-education;Patient/family education;Energy conservation;Joint Manipulations;Spinal Manipulations    PT Next Visit Plan  progress TA activation to counter-top planks; address internal TPs on R; address rounded shoulders/ thoracic kyphosis    PT Home Exercise Plan  log rolling; STS; diaphgramatic breathing; pelvic tilts; urge suppression techniques; pre-sqeeze and sneeze techniques, bow and arrow; water intake; pregnancy belts; work body mechanics, seated pelvic tilts, kegels/anti-kegels, low back stretch    Consulted and Agree with Plan of Care  Patient       Patient will benefit from skilled therapeutic intervention in order to improve the following deficits and impairments:  Abnormal gait, Decreased balance, Decreased activity tolerance, Decreased coordination, Decreased endurance, Decreased mobility, Decreased range of motion, Decreased strength, Difficulty walking, Increased fascial restricitons, Increased muscle spasms, Pain, Postural dysfunction  Visit Diagnosis: Other  muscle spasm  Abnormal posture  Mixed incontinence     Problem List Patient Active Problem List   Diagnosis Date Noted  . Pelvic pain affecting pregnancy in second trimester, antepartum 10/30/2018  . Urge incontinence of urine 10/30/2018  . Maternal varicella, non-immune 10/02/2018  . Low back pain 09/30/2018  . BMI 35.0-35.9,adult 09/30/2018  . Family history of breast cancer 09/30/2018  . Intractable migraine with aura with status migrainosus 11/08/2015   Cleophus MoltKeeli T. Gailes DPT, ATC Cleophus MoltKeeli T Gailes 12/09/2018, 4:07 PM  [place] [facility] [address] [place], [place], [address] Phone: [phone]   Fax:  [phone]  Name: Tracy Stevens MRN: [id number] Date of Birth: 1995-08-05

## 2018-12-09 NOTE — Patient Instructions (Addendum)
Anti-Kegel exercises:    With neutral spine, tighten pelvic floor by imagining you are stopping the flow of urine, squeezing only around the vagina and anus.  Quick-Flicks: Pull up and in gently and then take a deep breath and relax allowing enough time for the muscles to fully lengthen before the next contraction. Do _10__ repetitions in a row, allowing enough time to fully relax in between contractions.   Kegels: Long-Holds: Hold for _3__ seconds and then fully release, repeat _5__ times. (only hold until you can feel the muscles "give up")  Repeat both of these exercises _2__ times throughout the day     If this is difficult, start by performing while lying down as shown. As you get stronger, you can try in sitting, standing, etc. Make sure that you are NOT contracting your buttocks or inner thigh muscles!  Tip: If you are having a hard time feeling the muscles, try using a hand-held mirror to observe the muscles lift and draw in or feel with your hand to make sure you are squeezing when you think you are squeezing, not bearing down. Some people find it helpful to pretend you are "picking blueberries" with the vagina to feel the drawing in or try to make the clitoris "nod" and pull your sitz bones in toward the middle.      Do 2 sets of 15 tilts per day. Breathe in when you tilt forward (A) and out when you tuck under (B).    Tuck your hips under, then keep the tuck as you lean forward so you feel a stretch through your low back. Hold for 5 deep breaths, repeat 2-3 times, 1-2 times per day.

## 2018-12-10 ENCOUNTER — Ambulatory Visit (INDEPENDENT_AMBULATORY_CARE_PROVIDER_SITE_OTHER): Payer: Managed Care, Other (non HMO)

## 2018-12-10 ENCOUNTER — Other Ambulatory Visit: Payer: Self-pay

## 2018-12-10 DIAGNOSIS — Z362 Encounter for other antenatal screening follow-up: Secondary | ICD-10-CM

## 2018-12-10 DIAGNOSIS — Z3492 Encounter for supervision of normal pregnancy, unspecified, second trimester: Secondary | ICD-10-CM

## 2018-12-17 ENCOUNTER — Other Ambulatory Visit: Payer: Self-pay

## 2018-12-17 ENCOUNTER — Ambulatory Visit: Payer: 59 | Attending: Certified Nurse Midwife

## 2018-12-17 DIAGNOSIS — N3946 Mixed incontinence: Secondary | ICD-10-CM | POA: Diagnosis present

## 2018-12-17 DIAGNOSIS — M62838 Other muscle spasm: Secondary | ICD-10-CM | POA: Diagnosis present

## 2018-12-17 DIAGNOSIS — R293 Abnormal posture: Secondary | ICD-10-CM | POA: Diagnosis present

## 2018-12-17 NOTE — Patient Instructions (Addendum)
  Leaning forward on a counter top, tuck your hips under until you feel the low tummy muscles working and keep the tuck as you bring your hips forward until your shoulder, hip, knee, and ankle are all in a straight line. Hold for ~ 30 sec. Or until your muscles fatigue, before you lose your good form. Repeat until you have done a total of 1:30, in as long or short of holds as is necessary to keep good form.      Hold for 30 seconds (5 deep breaths) and repeat 2 times on right, 3 times on Left side once a day   Hold for 30 seconds (5 deep breaths) and repeat 2 times on right, 3 times on Left side once a day

## 2018-12-17 NOTE — Therapy (Signed)
[facility] [address] [place], [place], [address] Phone: [phone]   Fax:  [phone]  Physical Therapy Treatment The patient has been informed of current processes in place at Outpatient Rehab to protect patients from Covid-19 exposure including social distancing, schedule modifications, and new cleaning procedures. After discussing their particular risk with a therapist based on the patient's personal risk factors, the patient has decided to proceed with in-person therapy.   Patient Details  Name: Tracy Stevens MRN: [id number] Date of Birth: Oct 16, 1995 No data recorded  Encounter Date: 12/17/2018  PT End of Session - 12/19/18 1435    Visit Number  6    Number of Visits  10    Date for PT Re-Evaluation  01/20/19    Authorization Time Period  through 01/19/2018    Authorization - Visit Number  6    Authorization - Number of Visits  10    PT Start Time  1030    PT Stop Time  1130    PT Time Calculation (min)  60 min    Activity Tolerance  Patient tolerated treatment well;No increased pain    Behavior During Therapy  WFL for tasks assessed/performed       Past Medical History:  Diagnosis Date  . Congenital enlarged kidney   . Headache     Past Surgical History:  Procedure Laterality Date  . NASAL SINUS SURGERY      There were no vitals filed for this visit.    Pelvic Floor Physical Therapy Treatment Note  SCREENING  Changes in medications, allergies, or medical history?: none  SUBJECTIVE  Patient reports: Is doing alright, had some "pressure under her belly but she passed out last night before doing her exercises. Still occasionally getting sharp pull in the L hip. Her mom bought her an SI-belt which she has been wearing for the first part of her work day whenever it is busy.  Has not had pain with urination lately (since last visit)    Precautions:  [redacted] weeks pregnant   Pain update:  Location of  pain:  L glute around to the front of the L hip and Lower abdomen with sneezing Current pain:  2/10 (lower abdomen tightness)  Max pain:  5/10   Least pain:  0/10 Nature of pain: cramp; muscle spasm   *no pressure/tigntness in lower abdomen following treatment.  Patient Goals: *Be able to not feel this pressure, and it messes her up at work and daily activities   OBJECTIVE  Changes in: Posture/Observations:  L PSIS slightly high in standing pre-treatment  Special tests: Leg-length: equal Supine-to-long-sit: negative  Range of Motion/Flexibilty:  Decreased B thoracic ROM (not assessed this session)  Strength/MMT:  LE MMT:   Pelvic Floor  Internal Vaginal Exam: (from previous assessment) Strength (PERF): 4/5 (pre-treatment) 2 sec, 2 times. Following treatment 5/5, 3 sec, 1 time.  Symmetry: L>R for TTP (resolved through major areas following treatment Palpation: TTP throughout on L>R Prolapse: none   Palpation: TTP to L Psoas, Iliacus, and QL  Gait Analysis: Not assessed this session   INTERVENTIONS THIS SESSION: Manual Treatment performed TP release to L Psoas, Iliacus, and QL followed by L up-slip correction to decrease spasm and pain and allow for improved balance of musculature for improved function and decreased symptoms and improve pelvic alignment.  TherEx Educated on and practiced side-stretch and seated hip-flexor stretch To maintain and improve muscle length and allow for improved balance of  musculature for long-term symptom relief.    Total time: 60 minutes                            PT Short Term Goals - 11/11/18 1653      PT SHORT TERM GOAL #1   Title  Patient will demonstrate functional recruitment of TA with breathing, sit-to-stand, squatting/lifting, and walking to allow for improved pelvic brace coordination, improved balance, and decreased downward pressure on the pelvic organs.    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    Time  5    Period  Weeks    Status  New    Target Date  12/16/18      PT SHORT TERM GOAL #2   Title  Patient will report a reduction in LBP pain to no greater than 5/10 over the prior week to demonstrate symptom improvement.    Baseline  10-27 10/10 LBP when it spasms    Time  5    Period  Weeks    Status  New    Target Date  12/16/18      PT SHORT TERM GOAL #3   Title  Patient will report a reduction in suprapubic pressure/ pain to no greater than 2/10 over the prior week to demonstrate symptom improvement.    Baseline  10-27 suprapubic pain 5/10 NPS with bladder related symptoms.    Time  5    Period  Weeks    Status  New    Target Date  12/16/18        PT Long Term Goals - 11/11/18 1236      PT LONG TERM GOAL #1   Title  Patient will score less than or equal to 20% on the Female NIH-CPSI to demonstrate a reduction in pain, urinary symptoms, and an improved quality of life.    Baseline  10-27 22/43 on Female NIH (52%)    Time  10    Period  Weeks    Status  New    Target Date  01/20/19      PT LONG TERM GOAL #2   Title  Patient will score at or below 7/300 on the PFIQ to demonstrate a clinically meaningful decrease in disability and distress due to pelvic floor dysfunction.    Baseline  81/300 (57/100 UIQ; 0/100 on bowel/rectum; 24/100 on POPIQ) at eval.    Time  10    Period  Weeks    Status  New    Target Date  01/20/19      PT LONG TERM GOAL #3   Title  Patient will report urinating 6-8 times per day over the course of the prior week to demonstrate decreased frequency.    Baseline  10-27: voiding 8-10x/ day; with sensation of pressure an incomplete voiding.    Time  10    Period  Weeks    Status  New    Target Date  01/20/19            Plan - 12/19/18 1436    Clinical Impression Statement  Pt. Responded well to all interventions today, demonstrating improved pelvic alignment, decreased spasms and  resolution of lower abdominal discomfort as well as understanding and correct performance of all education and exercises provided today. They will continue to benefit from skilled physical therapy to work toward remaining goals and maximize function as well as decrease  likelihood of symptom increase or recurrence.     Personal Factors and Comorbidities  Profession;Comorbidity 1    Comorbidities  BMI;    Examination-Activity Limitations  Bed Mobility;Carry;Lift    Stability/Clinical Decision Making  Stable/Uncomplicated    Rehab Potential  Good    PT Frequency  1x / week    PT Duration  Other (comment)   10 weeks   PT Treatment/Interventions  ADLs/Self Care Home Management;Biofeedback;Cryotherapy;Moist Heat;Gait training;Stair training;Functional mobility training;Therapeutic activities;Therapeutic exercise;Balance training;Neuromuscular re-education;Patient/family education;Energy conservation;Joint Manipulations;Spinal Manipulations    PT Next Visit Plan  address internal TPs on R; address rounded shoulders/ thoracic kyphosis    PT Home Exercise Plan  log rolling; STS; diaphgramatic breathing; pelvic tilts; urge suppression techniques; pre-sqeeze and sneeze techniques, bow and arrow; water intake; pregnancy belts; work body mechanics, seated pelvic tilts, kegels/anti-kegels, low back stretch, hip-flexor stretch, side-stretch, counter-top planks    Consulted and Agree with Plan of Care  Patient       Patient will benefit from skilled therapeutic intervention in order to improve the following deficits and impairments:  Abnormal gait, Decreased balance, Decreased activity tolerance, Decreased coordination, Decreased endurance, Decreased mobility, Decreased range of motion, Decreased strength, Difficulty walking, Increased fascial restricitons, Increased muscle spasms, Pain, Postural dysfunction  Visit Diagnosis: Other muscle spasm  Abnormal posture  Mixed incontinence     Problem  List Patient Active Problem List   Diagnosis Date Noted  . Pelvic pain affecting pregnancy in second trimester, antepartum 10/30/2018  . Urge incontinence of urine 10/30/2018  . Maternal varicella, non-immune 10/02/2018  . Low back pain 09/30/2018  . BMI 35.0-35.9,adult 09/30/2018  . Family history of breast cancer 09/30/2018  . Intractable migraine with aura with status migrainosus 11/08/2015   Willa Rough DPT, ATC Willa Rough 12/19/2018, 2:38 PM  [place] MAIN [facility] SERVICES [address] [place], [place], [address] Phone: [phone]   Fax:  [phone]  Name: Tracy Stevens MRN: [id number] Date of Birth: Jun 13, 1995

## 2018-12-24 ENCOUNTER — Ambulatory Visit (INDEPENDENT_AMBULATORY_CARE_PROVIDER_SITE_OTHER): Payer: Managed Care, Other (non HMO) | Admitting: Certified Nurse Midwife

## 2018-12-24 ENCOUNTER — Other Ambulatory Visit: Payer: Self-pay

## 2018-12-24 ENCOUNTER — Ambulatory Visit: Payer: 59

## 2018-12-24 ENCOUNTER — Encounter: Payer: Self-pay | Admitting: Certified Nurse Midwife

## 2018-12-24 VITALS — BP 100/70 | HR 99 | Wt 187.4 lb

## 2018-12-24 DIAGNOSIS — N3946 Mixed incontinence: Secondary | ICD-10-CM

## 2018-12-24 DIAGNOSIS — M62838 Other muscle spasm: Secondary | ICD-10-CM | POA: Diagnosis not present

## 2018-12-24 DIAGNOSIS — Z3492 Encounter for supervision of normal pregnancy, unspecified, second trimester: Secondary | ICD-10-CM

## 2018-12-24 DIAGNOSIS — R293 Abnormal posture: Secondary | ICD-10-CM

## 2018-12-24 LAB — POCT URINALYSIS DIPSTICK OB
Bilirubin, UA: NEGATIVE
Blood, UA: NEGATIVE
Glucose, UA: NEGATIVE
Ketones, UA: NEGATIVE
Leukocytes, UA: NEGATIVE
Nitrite, UA: NEGATIVE
POC,PROTEIN,UA: NEGATIVE
Spec Grav, UA: >=1.03 — AB (ref 1.010–1.025)
Urobilinogen, UA: 0.2 E.U./dL
pH, UA: 5 (ref 5.0–8.0)

## 2018-12-24 NOTE — Therapy (Signed)
[place] MAIN [facility] SERVICES [address] [place], [place], [address] Phone: [phone]   Fax:  [phone]  Physical Therapy Treatment  The patient has been informed of current processes in place at Outpatient Rehab to protect patients from Covid-19 exposure including social distancing, schedule modifications, and new cleaning procedures. After discussing their particular risk with a therapist based on the patient's personal risk factors, the patient has decided to proceed with in-person therapy.   Patient Details  Name: Tracy Stevens MRN: [id number] Date of Birth: 1995-03-07 No data recorded  Encounter Date: 12/24/2018  PT End of Session - 12/24/18 1137    Visit Number  7    Number of Visits  10    Date for PT Re-Evaluation  01/20/19    Authorization Time Period  through 01/19/2018    Authorization - Visit Number  7    Authorization - Number of Visits  10    PT Start Time  1030    PT Stop Time  1130    PT Time Calculation (min)  60 min    Activity Tolerance  Patient tolerated treatment well;No increased pain    Behavior During Therapy  WFL for tasks assessed/performed       Past Medical History:  Diagnosis Date  . Congenital enlarged kidney   . Headache     Past Surgical History:  Procedure Laterality Date  . NASAL SINUS SURGERY      There were no vitals filed for this visit.      Pelvic Floor Physical Therapy Treatment Note  SCREENING  Changes in medications, allergies, or medical history?: none  SUBJECTIVE  Patient reports: Had a little discomfort yesterday but able to decrease it by doing her exercises. Most of her discomfort is first thing in the morning.    Precautions:  [redacted] weeks pregnant   Pain update:  Location of pain:  Lower abdomen  Current pain:  0/10 (lower abdomen tightness)  Max pain:  5/10   Least pain:  0/10 Nature of pain: cramp; muscle spasm   *no pressure/tigntness in lower abdomen  following treatment.  Patient Goals: *Be able to not feel this pressure, and it messes her up at work and daily activities   OBJECTIVE  Changes in: Posture/Observations:  L PSIS slightly high in standing pre-treatment  Special tests: Leg-length: equal Supine-to-long-sit: negative  Range of Motion/Flexibilty:  Decreased B thoracic ROM (not assessed this session)  Strength/MMT: Decreased ability to recruit the L glutes with squat, improved following treatment.    Pelvic Floor  Internal Vaginal Exam: (from previous assessment) Strength (PERF): 4/5 (pre-treatment) 2 sec, 2 times. Following treatment 5/5, 3 sec, 1 time.  Symmetry: L>R for TTP (resolved through major areas following treatment Palpation: TTP throughout on L>R Prolapse: none   Palpation: TTP to L QL, Glute MED, and OI.  Gait Analysis: Not assessed this session   INTERVENTIONS THIS SESSION: Manual Treatment performed TP release to L QL, Glute Med. And OI to decrease spasm and pain and allow for improved balance of musculature for improved function and decreased symptoms and improve pelvic alignment.  TherEx Educated on and practiced Squats with weight back on heels and cueing to engage glutes and lower abdomen as well as standing hip EXT/ABD with exhale before lift to improve timing and strength of hip stabilizers and towel scrunches to prevent medial collapse and to improve strength of muscles opposing tight musculature to allow reciprocal inhibition to improve balance of musculature  surrounding the pelvis and improve overall posture for optimal musculature length-tension relationship and function.  .  Theract: Educated on how to use a frozen water bottle and firm balls to massage and release plantar fascia and arch supports to decrease pain and medial collapse for improved balance of musculature and to prevent return of pain/dysfunction.  Total time: 60 minutes                            PT Short Term Goals - 12/24/18 1140      PT SHORT TERM GOAL #1   Title  Patient will demonstrate functional recruitment of TA with breathing, sit-to-stand, squatting/lifting, and walking to allow for improved pelvic brace coordination, improved balance, and decreased downward pressure on the pelvic organs.    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    Time  5    Period  Weeks    Status  Achieved    Target Date  12/16/18      PT SHORT TERM GOAL #2   Title  Patient will report a reduction in LBP pain to no greater than 5/10 over the prior week to demonstrate symptom improvement.    Baseline  10-27 10/10 LBP when it spasms    Time  5    Period  Weeks    Status  Achieved    Target Date  12/16/18      PT SHORT TERM GOAL #3   Title  Patient will report a reduction in suprapubic pressure/ pain to no greater than 2/10 over the prior week to demonstrate symptom improvement.    Baseline  10-27 suprapubic pain 5/10 NPS with bladder related symptoms.    Time  5    Period  Weeks    Status  Achieved    Target Date  12/16/18        PT Long Term Goals - 11/11/18 1236      PT LONG TERM GOAL #1   Title  Patient will score less than or equal to 20% on the Female NIH-CPSI to demonstrate a reduction in pain, urinary symptoms, and an improved quality of life.    Baseline  10-27 22/43 on Female NIH (52%)    Time  10    Period  Weeks    Status  New    Target Date  01/20/19      PT LONG TERM GOAL #2   Title  Patient will score at or below 7/300 on the PFIQ to demonstrate a clinically meaningful decrease in disability and distress due to pelvic floor dysfunction.    Baseline  81/300 (57/100 UIQ; 0/100 on bowel/rectum; 24/100 on POPIQ) at eval.    Time  10    Period  Weeks    Status  New    Target Date  01/20/19      PT LONG TERM GOAL #3   Title  Patient will report urinating 6-8 times per day  over the course of the prior week to demonstrate decreased frequency.    Baseline  10-27: voiding 8-10x/ day; with sensation of pressure an incomplete voiding.    Time  10    Period  Weeks    Status  New    Target Date  01/20/19            Plan - 12/24/18 1139    Clinical Impression Statement  Pt. Responded well to all interventions  today, demonstrating improved spasms and ability to recruit glutes functionally, improved squat mechanics, and between-session improvement in pain as well as understanding and correct performance of all education and exercises provided today. They will continue to benefit from skilled physical therapy to work toward remaining goals and maximize function as well as decrease likelihood of symptom increase or recurrence.     Personal Factors and Comorbidities  Profession;Comorbidity 1    Comorbidities  BMI;    Examination-Activity Limitations  Bed Mobility;Carry;Lift    Stability/Clinical Decision Making  Stable/Uncomplicated    Clinical Decision Making  Low    Rehab Potential  Good    PT Frequency  1x / week    PT Duration  Other (comment)   10 weeks   PT Treatment/Interventions  ADLs/Self Care Home Management;Biofeedback;Cryotherapy;Moist Heat;Gait training;Stair training;Functional mobility training;Therapeutic activities;Therapeutic exercise;Balance training;Neuromuscular re-education;Patient/family education;Energy conservation;Joint Manipulations;Spinal Manipulations    PT Next Visit Plan  address internal TPs on R; address rounded shoulders/ thoracic kyphosis    PT Home Exercise Plan  log rolling; STS; diaphgramatic breathing; pelvic tilts; urge suppression techniques; pre-sqeeze and sneeze techniques, bow and arrow; water intake; pregnancy belts; work body mechanics, seated pelvic tilts, kegels/anti-kegels, low back stretch, hip-flexor stretch, side-stretch, counter-top planks, Squats, Hip ABD/EXT, towel scrunches, arch supports, self foot release     Consulted and Agree with Plan of Care  Patient       Patient will benefit from skilled therapeutic intervention in order to improve the following deficits and impairments:  Abnormal gait, Decreased balance, Decreased activity tolerance, Decreased coordination, Decreased endurance, Decreased mobility, Decreased range of motion, Decreased strength, Difficulty walking, Increased fascial restricitons, Increased muscle spasms, Pain, Postural dysfunction  Visit Diagnosis: Other muscle spasm  Abnormal posture  Mixed incontinence     Problem List Patient Active Problem List   Diagnosis Date Noted  . Pelvic pain affecting pregnancy in second trimester, antepartum 10/30/2018  . Urge incontinence of urine 10/30/2018  . Maternal varicella, non-immune 10/02/2018  . Low back pain 09/30/2018  . BMI 35.0-35.9,adult 09/30/2018  . Family history of breast cancer 09/30/2018  . Intractable migraine with aura with status migrainosus 11/08/2015   Cleophus Molt DPT, ATC Cleophus Molt 12/24/2018, 11:41 AM  [place] [facility] MAIN [facility] SERVICES [address] [place], [place], [address] Phone: [phone]   Fax:  [phone]  Name: LARYN VENNING MRN: [id number] Date of Birth: [birth year]

## 2018-12-24 NOTE — Patient Instructions (Signed)
Hip Abduction (Standing)    Stand with support. Squeeze deep core and hold. Start with leg to the side/back on your toe and gently squeeze the outer hip to lift the toe off of the ground. Hold for 1 second then repeat. You should feel this in the outer hip, not the front, not your side,  And not your back. Repeat __10_ times. Do _3__ sets  a day.  HIP Extension - Standing    Stand with support. Squeeze deep core and hold. Start with leg to the back on your toe and gently squeeze the buttock to lift the toe off of the ground. Hold for 1 second then repeat. You should feel this in the outer hip, not the front, not your side,  And not your back. Repeat __10_ times. Do _3__ sets  a day.   Keep your trunk as one unit and let it hinge forward from the hips as you push your bottom back and bend your knees at the same rate that you bend your hips. Keep your weight back toward your heels but do not actually lift the toes off the ground. Exhale starting just before and all the way through standing to help engage the glutes and lower tummy muscles.  Do 2x10 once per day.    Do ~ 2x15 for each side.

## 2018-12-24 NOTE — Progress Notes (Signed)
ROB doing well. Feels movement. Anatomy u/s completed 11/25 (see below) results reviewed. Discussed 28 wk visit . She verbalizes and agrees to plan. Pt states that she has runs of tachycardia. Has already been evaluated by cardiology this pregnancy but is asking if she should see them again. States when she saw the she wore halter monitor , was told that everything was normal but to reach out if things change for her.  Given that she has recently had a couple of episodes of feeling heart race and getting dizzy /off balance. PT encouraged to reach out to them to discuss. Will follow up with next visit.  Follow up 4 wks.   Philip Aspen, CNM  Patient Name: Tracy Stevens DOB: July 09, 1995 MRN: [id number] ULTRASOUND REPORT  Location: [facility] Date of Service: 12/10/2018   Indications: Anatomy follow up ultrasound Findings:  Tracy Stevens intrauterine pregnancy is visualized with FHR at 155 BPM.  Fetal presentation is Cephalic.  Placenta: anterior. Grade: 1 AFI: subjectively normal.  Anatomic survey is complete.   There is no free peritoneal fluid in the cul de sac.  Impression: 1. [redacted]w[redacted]d Viable Singleton Intrauterine pregnancy previously established criteria. 2. Normal Anatomy Scan is now complete  Recommendations: 1.Clinical correlation with the patient's History and Physical Exam.   Jenine M. Albertine Grates    RDMS

## 2018-12-24 NOTE — Patient Instructions (Signed)

## 2018-12-31 ENCOUNTER — Telehealth: Payer: Self-pay

## 2018-12-31 NOTE — Telephone Encounter (Signed)
Called and spoke with patient.  Advised patient that it is ok to get COVID vaccine while pregnant, preferably after 12 weeks of gestational age and patient verbalized understanding.

## 2018-12-31 NOTE — Telephone Encounter (Signed)
Yes, I believe I sent this message to Crystal about starting after 12 weeks.

## 2018-12-31 NOTE — Telephone Encounter (Signed)
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 Pt states is 24 w 4 d pregnant. Pt wants to know if it is OK to get the COVID-19 vaccine.  Call pt today at [phone]

## 2018-12-31 NOTE — Telephone Encounter (Signed)
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 Do we have any guidelines for this vaccine in pregnancy ??    Thanks,  Deneise Lever

## 2019-01-01 ENCOUNTER — Ambulatory Visit: Payer: 59

## 2019-01-15 ENCOUNTER — Other Ambulatory Visit: Payer: Self-pay

## 2019-01-15 ENCOUNTER — Ambulatory Visit: Payer: 59

## 2019-01-15 DIAGNOSIS — M62838 Other muscle spasm: Secondary | ICD-10-CM | POA: Diagnosis not present

## 2019-01-15 DIAGNOSIS — R293 Abnormal posture: Secondary | ICD-10-CM

## 2019-01-15 DIAGNOSIS — N3946 Mixed incontinence: Secondary | ICD-10-CM

## 2019-01-15 NOTE — Patient Instructions (Signed)
    Sit up with a tall spine and cross one leg over the other knee. Hinge from the hip and lean until you can feel a stretch through your bottom hold for 5 deep breaths and then switch sides. Repeat 2-3 times on each side.   

## 2019-01-15 NOTE — Therapy (Signed)
[facility] [address] [place], [place], [address] Phone: [phone]   Fax:  [phone]  Physical Therapy Treatment  The patient has been informed of current processes in place at Outpatient Rehab to protect patients from Covid-19 exposure including social distancing, schedule modifications, and new cleaning procedures. After discussing their particular risk with a therapist based on the patient's personal risk factors, the patient has decided to proceed with in-person therapy.   Patient Details  Name: Tracy JaegerShianne T Stevens MRN: [id number] Date of Birth: 07/19/1995 No data recorded  Encounter Date: 01/15/2019  PT End of Session - 01/15/19 1246    Visit Number  8    Number of Visits  10    Date for PT Re-Evaluation  01/20/19    Authorization Time Period  through 01/19/2018    Authorization - Visit Number  8    Authorization - Number of Visits  10    PT Start Time  1130    PT Stop Time  1230    PT Time Calculation (min)  60 min    Activity Tolerance  Patient tolerated treatment well;No increased pain    Behavior During Therapy  WFL for tasks assessed/performed       Past Medical History:  Diagnosis Date  . Congenital enlarged kidney   . Headache     Past Surgical History:  Procedure Laterality Date  . NASAL SINUS SURGERY      There were no vitals filed for this visit.     Pelvic Floor Physical Therapy Treatment Note  SCREENING  Changes in medications, allergies, or medical history?: none  SUBJECTIVE  Patient reports: Has a spot in her back that with "start to tingle sometimes". R lower abdomen has sharp/burning sensation that starts at the R pubic bone and goes up to the hip when walking a lot for work.    Precautions:  [redacted] weeks pregnant   Pain update:  Location of pain:  R pel Current pain:  0/10 (lower abdomen tightness)  Max pain:  6/10   Least pain:  0/10 Nature of pain: cramp; muscle spasm    *feels more relaxed, not tight following treatment "can definitely tell a difference" and not TTP  Patient Goals: *Be able to not feel this pressure, and it messes her up at work and daily activities   OBJECTIVE  Changes in: Posture/Observations:  Anterior pelvic tilt. Slight curvature at T-L junction   Special tests:  Range of Motion/Flexibilty:  Decreased B thoracic ROM   Strength/MMT:  Pelvic Floor  Internal Vaginal Exam: (from previous assessment) Strength (PERF): 4/5 (pre-treatment) 2 sec, 2 times. Following treatment 5/5, 3 sec, 1 time.  Symmetry: L>R for TTP (resolved through major areas following treatment Palpation: TTP throughout on L>R Prolapse: none   Palpation: TTP to R Iliacus, Rectus abdominus near insertion, and L pectineus as well as L multifidus at ~T12  Gait Analysis: Not assessed this session   INTERVENTIONS THIS SESSION: Manual Treatment performed TP release to R Iliacus, Rectus abdominus near insertion, and L pectineus as well as L multifidus at ~T12 to decrease spasm and pain and allow for improved balance of musculature for improved function and decreased symptoms.   TherEx Educated on and practiced seated piriformis stretch and reviewed seated hip-flexor stretch and bow-and-arrow rotations to To maintain and improve muscle length and allow for improved balance of musculature for long-term symptom relief and to improve mobility of joint and surrounding connective tissue and  decrease pressure on nerve roots for improved conductivity and function of down-stream tissues.   Total time: 60 minutes                                   PT Short Term Goals - 12/24/18 1140      PT SHORT TERM GOAL #1   Title  Patient will demonstrate functional recruitment of TA with breathing, sit-to-stand, squatting/lifting, and walking to allow for improved pelvic brace coordination, improved balance, and decreased downward pressure on the  pelvic organs.    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    Time  5    Period  Weeks    Status  Achieved    Target Date  12/16/18      PT SHORT TERM GOAL #2   Title  Patient will report a reduction in LBP pain to no greater than 5/10 over the prior week to demonstrate symptom improvement.    Baseline  10-27 10/10 LBP when it spasms    Time  5    Period  Weeks    Status  Achieved    Target Date  12/16/18      PT SHORT TERM GOAL #3   Title  Patient will report a reduction in suprapubic pressure/ pain to no greater than 2/10 over the prior week to demonstrate symptom improvement.    Baseline  10-27 suprapubic pain 5/10 NPS with bladder related symptoms.    Time  5    Period  Weeks    Status  Achieved    Target Date  12/16/18        PT Long Term Goals - 11/11/18 1236      PT LONG TERM GOAL #1   Title  Patient will score less than or equal to 20% on the Female NIH-CPSI to demonstrate a reduction in pain, urinary symptoms, and an improved quality of life.    Baseline  10-27 22/43 on Female NIH (52%)    Time  10    Period  Weeks    Status  New    Target Date  01/20/19      PT LONG TERM GOAL #2   Title  Patient will score at or below 7/300 on the PFIQ to demonstrate a clinically meaningful decrease in disability and distress due to pelvic floor dysfunction.    Baseline  81/300 (57/100 UIQ; 0/100 on bowel/rectum; 24/100 on POPIQ) at eval.    Time  10    Period  Weeks    Status  New    Target Date  01/20/19      PT LONG TERM GOAL #3   Title  Patient will report urinating 6-8 times per day over the course of the prior week to demonstrate decreased frequency.    Baseline  10-27: voiding 8-10x/ day; with sensation of pressure an incomplete voiding.    Time  10    Period  Weeks    Status  New    Target Date  01/20/19            Plan - 01/15/19 1251    Clinical Impression Statement  Pt. Responded well to all interventions  today, demonstrating decreased spasm and pain/tingling as well as understanding and correct performance of all education and exercises provided today. They will continue to benefit from skilled physical therapy to work toward remaining goals  and maximize function as well as decrease likelihood of symptom increase or recurrence.     Personal Factors and Comorbidities  Profession;Comorbidity 1    Comorbidities  BMI;    Examination-Activity Limitations  Bed Mobility;Carry;Lift    Stability/Clinical Decision Making  Stable/Uncomplicated    Rehab Potential  Good    PT Frequency  1x / week    PT Duration  Other (comment)   10 weeks   PT Treatment/Interventions  ADLs/Self Care Home Management;Biofeedback;Cryotherapy;Moist Heat;Gait training;Stair training;Functional mobility training;Therapeutic activities;Therapeutic exercise;Balance training;Neuromuscular re-education;Patient/family education;Energy conservation;Joint Manipulations;Spinal Manipulations    PT Next Visit Plan  address internal TPs on R?; address rounded shoulders/ thoracic kyphosis    PT Home Exercise Plan  log rolling; STS; diaphgramatic breathing; pelvic tilts; urge suppression techniques; pre-sqeeze and sneeze techniques, bow and arrow; water intake; pregnancy belts; work body mechanics, seated pelvic tilts, kegels/anti-kegels, low back stretch, hip-flexor stretch, side-stretch, counter-top planks, Squats, Hip ABD/EXT, towel scrunches, arch supports, self foot release, Piriformis stretch.    Consulted and Agree with Plan of Care  Patient       Patient will benefit from skilled therapeutic intervention in order to improve the following deficits and impairments:  Abnormal gait, Decreased balance, Decreased activity tolerance, Decreased coordination, Decreased endurance, Decreased mobility, Decreased range of motion, Decreased strength, Difficulty walking, Increased fascial restricitons, Increased muscle spasms, Pain, Postural  dysfunction  Visit Diagnosis: Other muscle spasm  Abnormal posture  Mixed incontinence     Problem List Patient Active Problem List   Diagnosis Date Noted  . Pelvic pain affecting pregnancy in second trimester, antepartum 10/30/2018  . Urge incontinence of urine 10/30/2018  . Maternal varicella, non-immune 10/02/2018  . Low back pain 09/30/2018  . BMI 35.0-35.9,adult 09/30/2018  . Family history of breast cancer 09/30/2018  . Intractable migraine with aura with status migrainosus 11/08/2015   Willa Rough DPT, ATC Willa Rough 01/15/2019, 12:58 PM  [place] MAIN [facility] SERVICES [address] [place], [place], [address] Phone: [phone]   Fax:  [phone]  Name: MAREN WIESEN MRN: [id number] Date of Birth: June 24, 1995

## 2019-01-16 NOTE — L&D Delivery Note (Addendum)
       Delivery Note   Tracy Stevens is a 24 y.o. G1P0 at [redacted]w[redacted]d Estimated Date of Delivery: 04/18/19  PRE-OPERATIVE DIAGNOSIS:  1) [redacted]w[redacted]d pregnancy.   POST-OPERATIVE DIAGNOSIS:  1) [redacted]w[redacted]d pregnancy s/p Vaginal, Spontaneous   Delivery Type: Vaginal, Spontaneous    Delivery Anesthesia: Epidural   Labor Complications:  none    ESTIMATED BLOOD LOSS: 225 ml    FINDINGS:   1) female infant, Apgar scores of 9   at 1 minute and 9   at 5 minutes and a birthweight of   ounces.    2) Nuchal cord: no  SPECIMENS:   PLACENTA:   Appearance: Intact , 3 vessel cord, cord blood sample collected   Removal: Spontaneous      Disposition:  Held per protocol then discarded  DISPOSITION:  Infant to left in stable condition in the delivery room, with L&D personnel and mother,  NARRATIVE SUMMARY: Labor course:  [name] is a G1P0 at [redacted]w[redacted]d who presented for labor management.  She progressed well in labor with pitocin.  She received the appropriate epidural anesthesia and proceeded to complete dilation. She evidenced good maternal expulsive effort during the second stage. She went on to deliver a viable female infant " [name]" The placenta delivered without problems and was noted to be complete. A perineal and vaginal examination was performed. Lacerations:  None, periurethral skid marks, hemostatic not in need of repair. The patient tolerated this well.  Doreene Burke, CNM  04/14/2019 5:12 PM

## 2019-01-22 ENCOUNTER — Ambulatory Visit: Payer: Managed Care, Other (non HMO) | Attending: Certified Nurse Midwife

## 2019-01-22 ENCOUNTER — Other Ambulatory Visit: Payer: Self-pay

## 2019-01-22 DIAGNOSIS — M62838 Other muscle spasm: Secondary | ICD-10-CM

## 2019-01-22 DIAGNOSIS — N3946 Mixed incontinence: Secondary | ICD-10-CM

## 2019-01-22 DIAGNOSIS — R293 Abnormal posture: Secondary | ICD-10-CM | POA: Diagnosis present

## 2019-01-22 NOTE — Therapy (Signed)
[place] MAIN [facility] SERVICES [address] [place], [place], [address] Phone: [phone]   Fax:  [phone]  Physical Therapy Treatment  The patient has been informed of current processes in place at Outpatient Rehab to protect patients from Covid-19 exposure including social distancing, schedule modifications, and new cleaning procedures. After discussing their particular risk with a therapist based on the patient's personal risk factors, the patient has decided to proceed with in-person therapy.   Patient Details  Name: Tracy Stevens MRN: [id number] Date of Birth: 01/26/1995 No data recorded  Encounter Date: 01/22/2019  PT End of Session - 01/22/19 1537    Visit Number  9    Number of Visits  10    Date for PT Re-Evaluation  01/20/19    Authorization - Visit Number  9    Authorization - Number of Visits  10    PT Start Time  3267    PT Stop Time  1540    PT Time Calculation (min)  63 min    Activity Tolerance  Patient tolerated treatment well;No increased pain    Behavior During Therapy  WFL for tasks assessed/performed       Past Medical History:  Diagnosis Date  . Congenital enlarged kidney   . Headache     Past Surgical History:  Procedure Laterality Date  . NASAL SINUS SURGERY      There were no vitals filed for this visit.   Pelvic Floor Physical Therapy Treatment Note  SCREENING  Changes in medications, allergies, or medical history?: none  SUBJECTIVE  Patient reports: Work has been really busy, she is having some L LB pain that seemed to get worse after working 2 12 hr. Shifts. Has been trying to use the belly band her mom got for her but it rolls down and is too thick. Has pain occasionally at the pubic symphysis that seems to be later in the day when she is tired. She is tired all the time. Only peeing 3-4 times per day and sleeps through the night.   Precautions:  [redacted] weeks pregnant   Pain  update:  Location of pain:  L SIJ, pubic symphysis Current pain:  0/10   Max pain:  6/10   Least pain:  0/10 Nature of pain: ache ,sharp at pubic symphysis   *no pain with hip flexion/walking or at pubic symphysis following session  Patient Goals: *Be able to not feel this pressure, and it messes her up at work and daily activities   OBJECTIVE  Changes in: Posture/Observations:  Anterior pelvic tilt. Slight curvature at T-L junction   Special tests:  Range of Motion/Flexibilty:  Decreased B thoracic ROM (from prior session)  Strength/MMT:  Pelvic Floor  Internal Vaginal Exam: (from previous assessment) Strength (PERF): 4/5 (pre-treatment) 2 sec, 2 times. Following treatment 5/5, 3 sec, 1 time.  Symmetry: L>R for TTP (resolved through major areas following treatment Palpation: TTP throughout on L>R Prolapse: none   Palpation: TTP to  L multifidus and paraspinals near L5, and B piriformis, L iliopsoas and TFL  Gait Analysis: slight anterior pelvic tilt and posterior lean.  INTERVENTIONS THIS SESSION: Manual Treatment performed TP release to L multifidus and paraspinals near L5, and B piriformis and educated Pt. On how to perform self TP release to L iliopsoas and TFL with a tennis ball to decrease spasm and pain and allow for improved balance of musculature for improved function and decreased symptoms.   Self-care: Educated  on the importance of water intake on overall health and energy as well as normal frequency of urination. Educated on looking into mouth-guard options to treat sleep apnea and dangers of not treating. Educated on how to use adductor squeezes to decrease pubic symphysis pain and why she needs to emphasize strengthening exercises over stretches daily to prevent return of pain. Re-assessed and made new goals.  Total time: 60 minutes                                   PT Short Term Goals - 01/22/19 1535      PT SHORT TERM  GOAL #1   Title  Patient will demonstrate functional recruitment of TA with breathing, sit-to-stand, squatting/lifting, and walking to allow for improved pelvic brace coordination, improved balance, and decreased downward pressure on the pelvic organs.    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    Time  5    Period  Weeks    Status  Achieved    Target Date  12/16/18      PT SHORT TERM GOAL #2   Title  Patient will report a reduction in LBP pain to no greater than 5/10 over the prior week to demonstrate symptom improvement.    Baseline  10-27 10/10 LBP when it spasms    Time  5    Period  Weeks    Status  Achieved    Target Date  12/16/18      PT SHORT TERM GOAL #3   Title  Patient will report a reduction in suprapubic pressure/ pain to no greater than 2/10 over the prior week to demonstrate symptom improvement.    Baseline  10-27 suprapubic pain 5/10 NPS with bladder related symptoms.    Time  5    Period  Weeks    Status  Achieved    Target Date  12/16/18      PT SHORT TERM GOAL #4   Title  Pt. will demonstrate improved walking, standing and sitting posture to allow for maintenece of decreased pain and spasms.    Baseline  Pt demos posterior lean and anterior pelvic tilt in standing and walking. "prop-sitting" in sitting.    Time  6    Period  Weeks    Status  New    Target Date  03/05/19        PT Long Term Goals - 01/22/19 1535      PT LONG TERM GOAL #1   Title  Patient will score less than or equal to 20% on the Female NIH-CPSI to demonstrate a reduction in pain, urinary symptoms, and an improved quality of life.    Baseline  10-27 22/43 on Female NIH (52%) As of 01/22/19: 16%    Time  10    Period  Weeks    Status  Achieved    Target Date  01/20/19      PT LONG TERM GOAL #2   Title  Patient will score at or below 7/300 on the PFIQ to demonstrate a clinically meaningful decrease in disability and distress due to pelvic  floor dysfunction.    Baseline  81/300 (57/100 UIQ; 0/100 on bowel/rectum; 24/100 on POPIQ) at eval. As of 01/22/2019: 7/300    Time  10    Period  Weeks    Status  Achieved    Target Date  01/20/19      PT LONG TERM GOAL #3   Title  Patient will report urinating 6-8 times per day over the course of the prior week to demonstrate decreased frequency.    Baseline  10-27: voiding 8-10x/ day; with sensation of pressure an incomplete voiding.    Time  10    Period  Weeks    Status  Achieved    Target Date  01/20/19      PT LONG TERM GOAL #4   Title  Patient will describe pain no greater than 2/10 during full day of work to demonstrate improved functional ability and ability to maintain decreased pain independently.    Baseline  Pt. demonstrates much less pain and less frequent pain but has not been able to go more than a week without pain up to a 6/10 and she still has 10-12 weeks of her pregnancy.    Time  12    Period  Weeks    Status  New    Target Date  04/16/19            Plan - 01/22/19 1611    Clinical Impression Statement  Pt. has met al original goals but has not been able to maintain decreased pain across the span of 2 weeks to demonstrate ability to maintain improved pain and function. SHe will benefit from continued PT for the next 12 weeks with a tapering frequency to allow her to improve independent pain management while also allowing her to continue working a physical job through her last trimester of pregnancy.    Personal Factors and Comorbidities  Profession;Comorbidity 1    Comorbidities  BMI;    Examination-Activity Limitations  Bed Mobility;Carry;Lift    Clinical Decision Making  Low    Rehab Potential  Good    PT Frequency  1x / week    PT Duration  12 weeks    PT Treatment/Interventions  ADLs/Self Care Home Management;Biofeedback;Cryotherapy;Moist Heat;Gait training;Stair training;Functional mobility training;Therapeutic activities;Therapeutic exercise;Balance  training;Neuromuscular re-education;Patient/family education;Energy conservation;Joint Manipulations;Spinal Manipulations    PT Next Visit Plan  address internal TPs on R?; address rounded shoulders/ thoracic kyphosis, gait mechanics, review standing posture, sitting posture.    PT Home Exercise Plan  log rolling; STS; diaphgramatic breathing; pelvic tilts; urge suppression techniques; pre-sqeeze and sneeze techniques, bow and arrow; water intake; pregnancy belts; work body mechanics, seated pelvic tilts, kegels/anti-kegels, low back stretch, hip-flexor stretch, side-stretch, counter-top planks, Squats, Hip ABD/EXT, towel scrunches, arch supports, self foot release, Piriformis stretch.Self TP release with tennis ball, water intake education, seated adduction for pubic pain    Recommended Other Services  assessment for mouth guard to decrease sleep apnea    Consulted and Agree with Plan of Care  Patient       Patient will benefit from skilled therapeutic intervention in order to improve the following deficits and impairments:  Abnormal gait, Decreased balance, Decreased activity tolerance, Decreased coordination, Decreased endurance, Decreased mobility, Decreased range of motion, Decreased strength, Difficulty walking, Increased fascial restricitons, Increased muscle spasms, Pain, Postural dysfunction  Visit Diagnosis: Other muscle spasm  Abnormal posture  Mixed incontinence     Problem List Patient Active Problem List   Diagnosis Date Noted  . Pelvic pain affecting pregnancy in second trimester, antepartum 10/30/2018  . Urge incontinence of urine 10/30/2018  . Maternal varicella, non-immune 10/02/2018  . Low back pain 09/30/2018  . BMI 35.0-35.9,adult 09/30/2018  . Family history of breast cancer 09/30/2018  . Intractable migraine with aura  with status migrainosus 11/08/2015   Willa Rough DPT, ATC Willa Rough 01/22/2019, 4:16 PM  [place]  MAIN [facility] SERVICES [address] [place], [place], [address] Phone: [phone]   Fax:  [phone]  Name: Tracy Stevens MRN: [id number] Date of Birth: [birth year]

## 2019-01-22 NOTE — Patient Instructions (Signed)
  Exhale and squeeze do 10x2 if you have increased pain at the pubic symphysis.     This is The QL muscle  To perform release on this muscle, start by getting into this position by bridging the hips up and then slowly lowering your back, then your butt down to lengthen the low back then put the ball under you where you feel the tender spot and roll to the same side slightly to add pressure as needed. Hold still and take deep breaths until the pain is at least 50% less or, ideally, just pressure.   This is your piriformis    To release this muscle start in this position with your ankle crossed over the opposite knee. Place the tennis ball under your buttock where the tender spot is and then slightly roll your weight to the same side to put just enough pressure that it is uncomfortable. Hold and take deep breaths until the pain is at least 50% less or, ideally ,just pressure.   This is your Gluteus Medius and Minimus  To perform release on this muscle, start by getting into this position by bridging the hips up and then slowly lowering your back, then your butt down to lengthen the low back then put the ball under you where you feel the tender spot and roll to the same side slightly to add pressure as needed. Hold still and take deep breaths until the pain is at least 50% less or, ideally, just pressure.   These are your deep hip-flexor muscles. They are easiest to reach where they come together at the hip.   To perform release on this muscle, start by getting into this position by laying on your stomach then put the ball under you where you feel the tender spot and bring the opposite knee up/out to the side to add pressure as needed. Hold still and take deep breaths until the pain is at least 50% less or, ideally ,just pressure.     Available in 32 oz, 1/2 gallon, or 1 gallon sizes, all colors...... To help you increase your water intake, try getting a reusable water bottle with markings to  help you keep up with your hydration throughout the day. You should be drinking ~ 50% of your bodyweight in Oz. Of water each day or an average of ~ 64 Oz.  You can also add fruit or cucumbers to your water to make it more interesting and desirable. Cucumbers and honeydew are good choices because they are not acidic like citrus fruit and they add a refreshing flavor.    **look into Gaviscon for acid reflux  **try a mouth-guard for sleep-apnea

## 2019-01-23 ENCOUNTER — Other Ambulatory Visit: Payer: Self-pay

## 2019-01-23 ENCOUNTER — Other Ambulatory Visit: Payer: Managed Care, Other (non HMO)

## 2019-01-23 ENCOUNTER — Ambulatory Visit (INDEPENDENT_AMBULATORY_CARE_PROVIDER_SITE_OTHER): Payer: Managed Care, Other (non HMO) | Admitting: Certified Nurse Midwife

## 2019-01-23 VITALS — BP 109/57 | HR 81 | Wt 193.5 lb

## 2019-01-23 DIAGNOSIS — Z3403 Encounter for supervision of normal first pregnancy, third trimester: Secondary | ICD-10-CM | POA: Diagnosis not present

## 2019-01-23 DIAGNOSIS — Z3492 Encounter for supervision of normal pregnancy, unspecified, second trimester: Secondary | ICD-10-CM

## 2019-01-23 LAB — POCT URINALYSIS DIPSTICK OB
Bilirubin, UA: NEGATIVE
Blood, UA: NEGATIVE
Glucose, UA: NEGATIVE
Ketones, UA: NEGATIVE
Leukocytes, UA: NEGATIVE
Nitrite, UA: NEGATIVE
POC,PROTEIN,UA: NEGATIVE
Spec Grav, UA: 1.02 (ref 1.010–1.025)
Urobilinogen, UA: 0.2 E.U./dL
pH, UA: 6 (ref 5.0–8.0)

## 2019-01-23 MED ORDER — TETANUS-DIPHTH-ACELL PERTUSSIS 5-2.5-18.5 LF-MCG/0.5 IM SUSP
0.5000 mL | Freq: Once | INTRAMUSCULAR | Status: AC
  Administered 2019-01-23: 09:00:00 0.5 mL via INTRAMUSCULAR

## 2019-01-23 NOTE — Patient Instructions (Signed)
Glucose Tolerance Test During Pregnancy Why am I having this test? The glucose tolerance test (GTT) is done to check how your body processes sugar (glucose). This is one of several tests used to diagnose diabetes that develops during pregnancy (gestational diabetes mellitus). Gestational diabetes is a temporary form of diabetes that some women develop during pregnancy. It usually occurs during the second trimester of pregnancy and goes away after delivery. Testing (screening) for gestational diabetes usually occurs between 24 and 28 weeks of pregnancy. You may have the GTT test after having a 1-hour glucose screening test if the results from that test indicate that you may have gestational diabetes. You may also have this test if:  You have a history of gestational diabetes.  You have a history of giving birth to very large babies or have experienced repeated fetal loss (stillbirth).  You have signs and symptoms of diabetes, such as: ? Changes in your vision. ? Tingling or numbness in your hands or feet. ? Changes in hunger, thirst, and urination that are not otherwise explained by your pregnancy. What is being tested? This test measures the amount of glucose in your blood at different times during a period of 3 hours. This indicates how well your body is able to process glucose. What kind of sample is taken?  Blood samples are required for this test. They are usually collected by inserting a needle into a blood vessel. How do I prepare for this test?  For 3 days before your test, eat normally. Have plenty of carbohydrate-rich foods.  Follow instructions from your health care provider about: ? Eating or drinking restrictions on the day of the test. You may be asked to not eat or drink anything other than water (fast) starting 8-10 hours before the test. ? Changing or stopping your regular medicines. Some medicines may interfere with this test. Tell a health care provider about:  All  medicines you are taking, including vitamins, herbs, eye drops, creams, and over-the-counter medicines.  Any blood disorders you have.  Any surgeries you have had.  Any medical conditions you have. What happens during the test? First, your blood glucose will be measured. This is referred to as your fasting blood glucose, since you fasted before the test. Then, you will drink a glucose solution that contains a certain amount of glucose. Your blood glucose will be measured again 1, 2, and 3 hours after drinking the solution. This test takes about 3 hours to complete. You will need to stay at the testing location during this time. During the testing period:  Do not eat or drink anything other than the glucose solution.  Do not exercise.  Do not use any products that contain nicotine or tobacco, such as cigarettes and e-cigarettes. If you need help stopping, ask your health care provider. The testing procedure may vary among health care providers and hospitals. How are the results reported? Your results will be reported as milligrams of glucose per deciliter of blood (mg/dL) or millimoles per liter (mmol/L). Your health care provider will compare your results to normal ranges that were established after testing a large group of people (reference ranges). Reference ranges may vary among labs and hospitals. For this test, common reference ranges are:  Fasting: less than 95-105 mg/dL (5.3-5.8 mmol/L).  1 hour after drinking glucose: less than 180-190 mg/dL (10.0-10.5 mmol/L).  2 hours after drinking glucose: less than 155-165 mg/dL (8.6-9.2 mmol/L).  3 hours after drinking glucose: 140-145 mg/dL (7.8-8.1 mmol/L). What do the   results mean? Results within reference ranges are considered normal, meaning that your glucose levels are well-controlled. If two or more of your blood glucose levels are high, you may be diagnosed with gestational diabetes. If only one level is high, your health care  provider may suggest repeat testing or other tests to confirm a diagnosis. Talk with your health care provider about what your results mean. Questions to ask your health care provider Ask your health care provider, or the department that is doing the test:  When will my results be ready?  How will I get my results?  What are my treatment options?  What other tests do I need?  What are my next steps? Summary  The glucose tolerance test (GTT) is one of several tests used to diagnose diabetes that develops during pregnancy (gestational diabetes mellitus). Gestational diabetes is a temporary form of diabetes that some women develop during pregnancy.  You may have the GTT test after having a 1-hour glucose screening test if the results from that test indicate that you may have gestational diabetes. You may also have this test if you have any symptoms or risk factors for gestational diabetes.  Talk with your health care provider about what your results mean. This information is not intended to replace advice given to you by your health care provider. Make sure you discuss any questions you have with your health care provider. Document Revised: 04/24/2018 Document Reviewed: 08/13/2016 Elsevier Patient Education  2020 [employer]  

## 2019-01-23 NOTE — Progress Notes (Signed)
ROB doing well. Feels good movement. Having back discomfort , is see PT that is helping. Discussed belly bands and self help measures. Reviewed labs today, TDap/BTC/RPR/CBC/glucose testing. Discussed BC after baby. State she was on Nexplanon but gained a lot of weight. She is not able to take estrogen due to Migranes and risk for stroke (pt mother had 3 strokes). Discussed Progestin only methods. Information given. Sample birth plan given. Will follow up next visit on [facility] and Birth plan. Follow up 2 wks.   Doreene Burke, CNM

## 2019-01-24 LAB — CBC
Hematocrit: 33.8 % — ABNORMAL LOW (ref 34.0–46.6)
Hemoglobin: 11.5 g/dL (ref 11.1–15.9)
MCH: 30.9 pg (ref 26.6–33.0)
MCHC: 34 g/dL (ref 31.5–35.7)
MCV: 91 fL (ref 79–97)
Platelets: 266 10*3/uL (ref 150–450)
RBC: 3.72 x10E6/uL — ABNORMAL LOW (ref 3.77–5.28)
RDW: 12.7 % (ref 11.7–15.4)
WBC: 10.7 10*3/uL (ref 3.4–10.8)

## 2019-01-24 LAB — RPR: RPR Ser Ql: NONREACTIVE

## 2019-01-24 LAB — GLUCOSE, 1 HOUR GESTATIONAL: Gestational Diabetes Screen: 165 mg/dL — ABNORMAL HIGH (ref 65–139)

## 2019-01-26 ENCOUNTER — Other Ambulatory Visit: Payer: Self-pay | Admitting: Certified Nurse Midwife

## 2019-01-26 DIAGNOSIS — R7309 Other abnormal glucose: Secondary | ICD-10-CM

## 2019-01-26 NOTE — Progress Notes (Signed)
Abnormal glucose screen. 3 hr ordered. Pt notified.   Doreene Burke, CNM

## 2019-01-30 ENCOUNTER — Other Ambulatory Visit: Payer: Managed Care, Other (non HMO)

## 2019-01-30 ENCOUNTER — Other Ambulatory Visit: Payer: Self-pay

## 2019-01-30 DIAGNOSIS — R7309 Other abnormal glucose: Secondary | ICD-10-CM

## 2019-01-31 LAB — GESTATIONAL GLUCOSE TOLERANCE
Glucose, Fasting: 74 mg/dL (ref 65–94)
Glucose, GTT - 1 Hour: 168 mg/dL (ref 65–179)
Glucose, GTT - 2 Hour: 131 mg/dL (ref 65–154)
Glucose, GTT - 3 Hour: 56 mg/dL — ABNORMAL LOW (ref 65–139)

## 2019-02-06 ENCOUNTER — Ambulatory Visit: Payer: Managed Care, Other (non HMO)

## 2019-02-06 ENCOUNTER — Other Ambulatory Visit: Payer: Self-pay

## 2019-02-06 DIAGNOSIS — M62838 Other muscle spasm: Secondary | ICD-10-CM

## 2019-02-06 DIAGNOSIS — R293 Abnormal posture: Secondary | ICD-10-CM

## 2019-02-06 DIAGNOSIS — N3946 Mixed incontinence: Secondary | ICD-10-CM

## 2019-02-06 NOTE — Therapy (Addendum)
[place] MAIN [facility] SERVICES [address] [place], [place], [address] Phone: [phone]   Fax:  [phone]  Physical Therapy Treatment  The patient has been informed of current processes in place at Outpatient Rehab to protect patients from Covid-19 exposure including social distancing, schedule modifications, and new cleaning procedures. After discussing their particular risk with a therapist based on the patient's personal risk factors, the patient has decided to proceed with in-person therapy.   Patient Details  Name: Tracy Stevens MRN: [id number] Date of Birth: 1995/03/27 No data recorded  Encounter Date: 02/06/2019  PT End of Session - 02/23/19 0816    Visit Number  10    Number of Visits  16    Date for PT Re-Evaluation  01/20/19    Authorization - Visit Number  1    Authorization - Number of Visits  6    PT Start Time  1109    PT Stop Time  1209    PT Time Calculation (min)  60 min    Activity Tolerance  Patient tolerated treatment well;No increased pain    Behavior During Therapy  WFL for tasks assessed/performed       Past Medical History:  Diagnosis Date  . Congenital enlarged kidney   . Headache     Past Surgical History:  Procedure Laterality Date  . NASAL SINUS SURGERY      There were no vitals filed for this visit.     Pelvic Floor Physical Therapy Treatment Note  SCREENING  Changes in medications, allergies, or medical history?: none  SUBJECTIVE  Patient reports: She is doing pretty well. Still having ore back and foot pain on days when she works, exercises are going well. When she can take a break and rest her pain comes down. Thinks her shoes could be contributing, wearing clogs most the time. 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 Is needing to sit with her feet propped up fdor ~ 20 min. Before walking to her car due to pain.   Precautions:  [redacted] weeks pregnant   Pain  update:  Location of pain:  L SIJ, B feet Current pain:  0/10   Max pain:  5-6/10   Least pain:  0/10 Nature of pain: ache ,sharp at pubic symphysis   **no pain following treatment.  Patient Goals: *Be able to not feel this pressure, and it messes her up at work and daily activities   OBJECTIVE  Changes in: Posture/Observations:  Anterior pelvic tilt. Slight curvature at T-L junction   Special tests:  Range of Motion/Flexibilty:  Decreased B thoracic ROM (from prior session)  Strength/MMT:  Pelvic Floor  Internal Vaginal Exam: (from previous assessment) Strength (PERF): 4/5 (pre-treatment) 2 sec, 2 times. Following treatment 5/5, 3 sec, 1 time.  Symmetry: L>R for TTP (resolved through major areas following treatment Palpation: TTP throughout on L>R Prolapse: none   Palpation: TTP to B Multifidus near T-L junction and QL  Gait Analysis: slight anterior pelvic tilt and posterior lean.  INTERVENTIONS THIS SESSION: Manual Treatment performed TP release to lumber multifidus and QL to decrease spasm and pain and allow for improved balance of musculature for improved function and decreased symptoms. Performed STM and MFR to B feet to decrease inflammation and pain and allow for improved recruitment of foot intrinsic muscles to improve arch and decrease plantar fasciitis as well as support improved posture up the chain.  Self-care: Educated on using frozen water  bottle, compression socks, ETC to decrease swelling in feet at work and prevent/minimize pain and allow for continued work until near due date.  Therex: Reviewed HEP and encouraged Pt. To emphasize her strengthening exercises to allow for long-term pain relief rather than short-term decrease from stretches.  Total time: 60 minutes                            PT Short Term Goals - 01/22/19 1535      PT SHORT TERM GOAL #1   Title  Patient will demonstrate functional recruitment of TA with  breathing, sit-to-stand, squatting/lifting, and walking to allow for improved pelvic brace coordination, improved balance, and decreased downward pressure on the pelvic organs.    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    Time  5    Period  Weeks    Status  Achieved    Target Date  12/16/18      PT SHORT TERM GOAL #2   Title  Patient will report a reduction in LBP pain to no greater than 5/10 over the prior week to demonstrate symptom improvement.    Baseline  10-27 10/10 LBP when it spasms    Time  5    Period  Weeks    Status  Achieved    Target Date  12/16/18      PT SHORT TERM GOAL #3   Title  Patient will report a reduction in suprapubic pressure/ pain to no greater than 2/10 over the prior week to demonstrate symptom improvement.    Baseline  10-27 suprapubic pain 5/10 NPS with bladder related symptoms.    Time  5    Period  Weeks    Status  Achieved    Target Date  12/16/18      PT SHORT TERM GOAL #4   Title  Pt. will demonstrate improved walking, standing and sitting posture to allow for maintenece of decreased pain and spasms.    Baseline  Pt demos posterior lean and anterior pelvic tilt in standing and walking. "prop-sitting" in sitting.    Time  6    Period  Weeks    Status  New    Target Date  03/05/19        PT Long Term Goals - 01/22/19 1535      PT LONG TERM GOAL #1   Title  Patient will score less than or equal to 20% on the Female NIH-CPSI to demonstrate a reduction in pain, urinary symptoms, and an improved quality of life.    Baseline  10-27 22/43 on Female NIH (52%) As of 01/22/19: 16%    Time  10    Period  Weeks    Status  Achieved    Target Date  01/20/19      PT LONG TERM GOAL #2   Title  Patient will score at or below 7/300 on the PFIQ to demonstrate a clinically meaningful decrease in disability and distress due to pelvic floor dysfunction.    Baseline  81/300 (57/100 UIQ; 0/100 on bowel/rectum;  24/100 on POPIQ) at eval. As of 01/22/2019: 7/300    Time  10    Period  Weeks    Status  Achieved    Target Date  01/20/19      PT LONG TERM GOAL #3   Title  Patient will report urinating 6-8 times per day over the course  of the prior week to demonstrate decreased frequency.    Baseline  10-27: voiding 8-10x/ day; with sensation of pressure an incomplete voiding.    Time  10    Period  Weeks    Status  Achieved    Target Date  01/20/19      PT LONG TERM GOAL #4   Title  Patient will describe pain no greater than 2/10 during full day of work to demonstrate improved functional ability and ability to maintain decreased pain independently.    Baseline  Pt. demonstrates much less pain and less frequent pain but has not been able to go more than a week without pain up to a 6/10 and she still has 10-12 weeks of her pregnancy.    Time  12    Period  Weeks    Status  New    Target Date  04/16/19            Plan - 02/23/19 0818    Clinical Impression Statement  Pt. Responded well to all interventions today, demonstrating improved foot pain, back pain, and understanding on and performance of her HEP for increased efficacy as well as understanding of all education provided today. They will continue to benefit from skilled physical therapy to work toward remaining goals and maximize function as well as decrease likelihood of symptom increase or recurrence.     PT Next Visit Plan  address internal TPs on R?; address rounded shoulders/ thoracic kyphosis, gait mechanics, review standing posture, sitting posture.    PT Home Exercise Plan  log rolling; STS; diaphgramatic breathing; pelvic tilts; urge suppression techniques; pre-sqeeze and sneeze techniques, bow and arrow; water intake; pregnancy belts; work body mechanics, seated pelvic tilts, kegels/anti-kegels, low back stretch, hip-flexor stretch, side-stretch, counter-top planks, Squats, Hip ABD/EXT, towel scrunches, arch supports, self foot  release, Piriformis stretch.Self TP release with tennis ball, water intake education, seated adduction for pubic pain    Consulted and Agree with Plan of Care  Patient       Patient will benefit from skilled therapeutic intervention in order to improve the following deficits and impairments:     Visit Diagnosis: Other muscle spasm  Abnormal posture  Mixed incontinence     Problem List Patient Active Problem List   Diagnosis Date Noted  . Pelvic pain affecting pregnancy in second trimester, antepartum 10/30/2018  . Urge incontinence of urine 10/30/2018  . Maternal varicella, non-immune 10/02/2018  . Low back pain 09/30/2018  . BMI 35.0-35.9,adult 09/30/2018  . Family history of breast cancer 09/30/2018  . Intractable migraine with aura with status migrainosus 11/08/2015   Cleophus Molt DPT, ATC Cleophus Molt 02/23/2019, 8:25 AM  [facility] MAIN [facility] SERVICES [address] [place], [place], [address] Phone: [phone]   Fax:  [phone]  Name: Tracy Stevens MRN: [id number] Date of Birth: [birth year]

## 2019-02-06 NOTE — Patient Instructions (Signed)
   Make sure you are wearing compression socks when you are at work.   If you start having morning foot pain, wrap your feet with ACE bandages before bed.   Use a frozen water bottle to roll your foot on at night if you have pain.  When you prop your feet up at night, do ankle pumps to help decrease swelling.  Keep emphasizing your Left rotations "bow-and arrow" and right side stretch (reaching to the left) and your calf stretch. Counter top plank and squats.

## 2019-02-10 ENCOUNTER — Ambulatory Visit (INDEPENDENT_AMBULATORY_CARE_PROVIDER_SITE_OTHER): Payer: Managed Care, Other (non HMO) | Admitting: Certified Nurse Midwife

## 2019-02-10 ENCOUNTER — Other Ambulatory Visit: Payer: Self-pay

## 2019-02-10 VITALS — BP 113/66 | HR 86 | Wt 193.4 lb

## 2019-02-10 DIAGNOSIS — Z3A30 30 weeks gestation of pregnancy: Secondary | ICD-10-CM

## 2019-02-10 DIAGNOSIS — Z3403 Encounter for supervision of normal first pregnancy, third trimester: Secondary | ICD-10-CM

## 2019-02-10 LAB — POCT URINALYSIS DIPSTICK OB
Bilirubin, UA: NEGATIVE
Blood, UA: NEGATIVE
Glucose, UA: NEGATIVE
Ketones, UA: NEGATIVE
Leukocytes, UA: NEGATIVE
Nitrite, UA: NEGATIVE
POC,PROTEIN,UA: NEGATIVE
Spec Grav, UA: 1.02 (ref 1.010–1.025)
Urobilinogen, UA: 0.2 E.U./dL
pH, UA: 5 (ref 5.0–8.0)

## 2019-02-10 NOTE — Progress Notes (Signed)
ROB doing well. Discussed breastfeeding, states she is going to try. RSB information reviewed. PT encouraged to try . She is planning on comdoms for birth control and wants to try to avoid epidural but is open to IV medications for pain. 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 Follow up 2 wks.   Doreene Burke, CNM

## 2019-02-10 NOTE — Patient Instructions (Signed)
[place] Pediatrician List  [facility]  [address], [place], [place] [address]  Phone: [phone]  [facility] (second location)  [address] [place], [place] [address]  Phone: [phone]  [facility] ([place]) [address], [place], [place] [address] Phone: [phone]  [facility]  [address] [place],  [address]  Phone: [phone] 

## 2019-02-18 ENCOUNTER — Other Ambulatory Visit: Payer: Self-pay

## 2019-02-18 ENCOUNTER — Ambulatory Visit: Payer: Managed Care, Other (non HMO) | Attending: Certified Nurse Midwife

## 2019-02-18 DIAGNOSIS — N3946 Mixed incontinence: Secondary | ICD-10-CM

## 2019-02-18 DIAGNOSIS — M62838 Other muscle spasm: Secondary | ICD-10-CM | POA: Diagnosis present

## 2019-02-18 DIAGNOSIS — R293 Abnormal posture: Secondary | ICD-10-CM | POA: Diagnosis present

## 2019-02-18 NOTE — Therapy (Addendum)
[place] [facility] MAIN [facility] SERVICES [address] [place], [place], [address] Phone: [phone]   Fax:  [phone]  Physical Therapy Treatment  The patient has been informed of current processes in place at Outpatient Rehab to protect patients from Covid-19 exposure including social distancing, schedule modifications, and new cleaning procedures. After discussing their particular risk with a therapist based on the patient's personal risk factors, the patient has decided to proceed with in-person therapy.   Patient Details  Name: Tracy Stevens MRN: [id number] Date of Birth: 11-Feb-1995 No data recorded  Encounter Date: 02/18/2019  PT End of Session - 02/23/19 0830    Visit Number  11    Number of Visits  16    Date for PT Re-Evaluation  04/16/19    Authorization - Visit Number  2    Authorization - Number of Visits  6    PT Start Time  1335    PT Stop Time  1430    PT Time Calculation (min)  55 min    Activity Tolerance  Patient tolerated treatment well;No increased pain    Behavior During Therapy  WFL for tasks assessed/performed       Past Medical History:  Diagnosis Date  . Congenital enlarged kidney   . Headache     Past Surgical History:  Procedure Laterality Date  . NASAL SINUS SURGERY      There were no vitals filed for this visit.   Pelvic Floor Physical Therapy Treatment Note  SCREENING  Changes in medications, allergies, or medical history?: none  SUBJECTIVE  Patient reports: Doing pretty good, a little better. Her OB wrote a note for work to create modifications for her because she keeps getting heavy, "total" patients to make accommodations so she can protect her body. Is having some tingling/numbness in the L back, tingling/burning in the R hip/groin if she is on her feet too long. Has been having some cramping (sounds like dehydration/braxton hicks.) Feet are doing pretty good.   Precautions:  [redacted] weeks pregnant    Pain update:  Location of pain:  R  Groin/pubic symphysis,R hip-flexor Current pain:  0/10   Max pain:  5-6/10   Least pain:  0/10 Nature of pain: ache ,sharp at pubic symphysis   **no pain following treatment  Patient Goals: *Be able to not feel this pressure, and it messes her up at work and daily activities   OBJECTIVE  Changes in: Posture/Observations:  Anterior pelvic tilt. Slight curvature at T-L junction   Special tests:  Range of Motion/Flexibilty:  Decreased B thoracic ROM (from prior session)  Strength/MMT:  Pelvic Floor  Internal Vaginal Exam: (from previous assessment) Strength (PERF): 4/5 (pre-treatment) 2 sec, 2 times. Following treatment 5/5, 3 sec, 1 time.  Symmetry: L>R for TTP (resolved through major areas following treatment Palpation: TTP throughout on L>R Prolapse: none   Palpation: TTP to R Pectineus and adductor brevis.  Gait Analysis: slight anterior pelvic tilt and posterior lean.  INTERVENTIONS THIS SESSION: Manual Treatment: Performed TP release to R pectineus and adductor brevis to decrease spasm and pain and allow for improved balance of musculature for improved function and decreased symptoms.   Therex: Educated on and practiced butterfly stretch for adductors To maintain and improve muscle length and allow for improved balance of musculature for long-term symptom relief.  Total time: 55 minutes  PT Short Term Goals - 01/22/19 1535      PT SHORT TERM GOAL #1   Title  Patient will demonstrate functional recruitment of TA with breathing, sit-to-stand, squatting/lifting, and walking to allow for improved pelvic brace coordination, improved balance, and decreased downward pressure on the pelvic organs.    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    Time  5    Period  Weeks    Status  Achieved    Target Date  12/16/18      PT  SHORT TERM GOAL #2   Title  Patient will report a reduction in LBP pain to no greater than 5/10 over the prior week to demonstrate symptom improvement.    Baseline  10-27 10/10 LBP when it spasms    Time  5    Period  Weeks    Status  Achieved    Target Date  12/16/18      PT SHORT TERM GOAL #3   Title  Patient will report a reduction in suprapubic pressure/ pain to no greater than 2/10 over the prior week to demonstrate symptom improvement.    Baseline  10-27 suprapubic pain 5/10 NPS with bladder related symptoms.    Time  5    Period  Weeks    Status  Achieved    Target Date  12/16/18      PT SHORT TERM GOAL #4   Title  Pt. will demonstrate improved walking, standing and sitting posture to allow for maintenece of decreased pain and spasms.    Baseline  Pt demos posterior lean and anterior pelvic tilt in standing and walking. "prop-sitting" in sitting.    Time  6    Period  Weeks    Status  New    Target Date  03/05/19        PT Long Term Goals - 01/22/19 1535      PT LONG TERM GOAL #1   Title  Patient will score less than or equal to 20% on the Female NIH-CPSI to demonstrate a reduction in pain, urinary symptoms, and an improved quality of life.    Baseline  10-27 22/43 on Female NIH (52%) As of 01/22/19: 16%    Time  10    Period  Weeks    Status  Achieved    Target Date  01/20/19      PT LONG TERM GOAL #2   Title  Patient will score at or below 7/300 on the PFIQ to demonstrate a clinically meaningful decrease in disability and distress due to pelvic floor dysfunction.    Baseline  81/300 (57/100 UIQ; 0/100 on bowel/rectum; 24/100 on POPIQ) at eval. As of 01/22/2019: 7/300    Time  10    Period  Weeks    Status  Achieved    Target Date  01/20/19      PT LONG TERM GOAL #3   Title  Patient will report urinating 6-8 times per day over the course of the prior week to demonstrate decreased frequency.    Baseline  10-27: voiding 8-10x/ day; with sensation of pressure an  incomplete voiding.    Time  10    Period  Weeks    Status  Achieved    Target Date  01/20/19      PT LONG TERM GOAL #4   Title  Patient will describe pain no greater than 2/10 during full day of work to demonstrate improved  functional ability and ability to maintain decreased pain independently.    Baseline  Pt. demonstrates much less pain and less frequent pain but has not been able to go more than a week without pain up to a 6/10 and she still has 10-12 weeks of her pregnancy.    Time  12    Period  Weeks    Status  New    Target Date  04/16/19            Plan - 02/23/19 0831    Clinical Impression Statement  Pt. Responded well to all interventions today, demonstrating decreased spasm and pain, better balance of musculature surrounding the pelvis, as well as understanding and correct performance of all education and exercises provided today. They will continue to benefit from skilled physical therapy to work toward remaining goals and maximize function as well as decrease likelihood of symptom increase or recurrence.     PT Next Visit Plan  address internal TPs on R?; address rounded shoulders/ thoracic kyphosis, gait mechanics, review standing posture, sitting posture.    PT Home Exercise Plan  log rolling; STS; diaphgramatic breathing; pelvic tilts; urge suppression techniques; pre-sqeeze and sneeze techniques, Stevens and arrow; water intake; pregnancy belts; work body mechanics, seated pelvic tilts, kegels/anti-kegels, low back stretch, hip-flexor stretch, side-stretch, counter-top planks, Squats, Hip ABD/EXT, towel scrunches, arch supports, self foot release, Piriformis stretch.Self TP release with tennis ball, water intake education, seated adduction for pubic pain, butterfly stretch in supine    Consulted and Agree with Plan of Care  Patient       Patient will benefit from skilled therapeutic intervention in order to improve the following deficits and impairments:     Visit  Diagnosis: Other muscle spasm  Abnormal posture  Mixed incontinence     Problem List Patient Active Problem List   Diagnosis Date Noted  . Pelvic pain affecting pregnancy in second trimester, antepartum 10/30/2018  . Urge incontinence of urine 10/30/2018  . Maternal varicella, non-immune 10/02/2018  . Low back pain 09/30/2018  . BMI 35.0-35.9,adult 09/30/2018  . Family history of breast cancer 09/30/2018  . Intractable migraine with aura with status migrainosus 11/08/2015   Willa Rough DPT, ATC Willa Rough 02/23/2019, 8:33 AM  [place] MAIN [facility] SERVICES [address] [place], [place], [address] Phone: [phone]   Fax:  [phone]  Name: ANNISTON NELLUMS MRN: [id number] Date of Birth: 06/15/1995

## 2019-02-18 NOTE — Patient Instructions (Signed)
   Bring feet together and let the knees fall out to the sides. Hold for 5 deep breaths, rest then repeat 2 more times.

## 2019-02-25 ENCOUNTER — Ambulatory Visit (INDEPENDENT_AMBULATORY_CARE_PROVIDER_SITE_OTHER): Payer: Managed Care, Other (non HMO) | Admitting: Certified Nurse Midwife

## 2019-02-25 ENCOUNTER — Other Ambulatory Visit: Payer: Self-pay

## 2019-02-25 VITALS — BP 91/62 | HR 88 | Wt 195.3 lb

## 2019-02-25 DIAGNOSIS — Z0289 Encounter for other administrative examinations: Secondary | ICD-10-CM

## 2019-02-25 DIAGNOSIS — Z3403 Encounter for supervision of normal first pregnancy, third trimester: Secondary | ICD-10-CM

## 2019-02-25 DIAGNOSIS — N898 Other specified noninflammatory disorders of vagina: Secondary | ICD-10-CM

## 2019-02-25 LAB — POCT URINALYSIS DIPSTICK OB
Bilirubin, UA: NEGATIVE
Blood, UA: NEGATIVE
Glucose, UA: NEGATIVE
Ketones, UA: NEGATIVE
Leukocytes, UA: NEGATIVE
Nitrite, UA: NEGATIVE
POC,PROTEIN,UA: NEGATIVE
Spec Grav, UA: 1.01 (ref 1.010–1.025)
Urobilinogen, UA: 0.2 E.U./dL
pH, UA: 5 (ref 5.0–8.0)

## 2019-02-25 NOTE — Progress Notes (Signed)
ROB doing well. Feels good movement. Continues to have vaginal discharge, reassurance given. She is waiting on cariology to schedule appointment for the tachycardia. Reviewed red flag symptoms  .Discussed follow up 2 wk with Marcelino Duster.   Doreene Burke, CNM

## 2019-02-25 NOTE — Patient Instructions (Signed)
[place] Pediatrician List  [facility]  [address], [place], [place] [address]  Phone: [phone]  [facility] (second location)  [address] [place], [place] [address]  Phone: [phone]  [facility]) [address], [place], [place] [address] Phone: [phone]  [facility]  [address] [place], [place] [address]  Phone: [phone] 

## 2019-02-27 ENCOUNTER — Telehealth: Payer: Self-pay

## 2019-02-27 NOTE — Telephone Encounter (Signed)
mychart message sent to patient- FMLA paperwork complete and ready for pickup. Second set of papers were blank. They will need to be finished by patient before they can be filled out.

## 2019-03-11 ENCOUNTER — Ambulatory Visit (INDEPENDENT_AMBULATORY_CARE_PROVIDER_SITE_OTHER): Payer: Managed Care, Other (non HMO) | Admitting: Certified Nurse Midwife

## 2019-03-11 ENCOUNTER — Other Ambulatory Visit: Payer: Self-pay

## 2019-03-11 ENCOUNTER — Telehealth: Payer: Self-pay | Admitting: Certified Nurse Midwife

## 2019-03-11 VITALS — BP 107/61 | HR 80 | Wt 196.2 lb

## 2019-03-11 DIAGNOSIS — Z3403 Encounter for supervision of normal first pregnancy, third trimester: Secondary | ICD-10-CM

## 2019-03-11 DIAGNOSIS — O99419 Diseases of the circulatory system complicating pregnancy, unspecified trimester: Secondary | ICD-10-CM

## 2019-03-11 DIAGNOSIS — Z3A34 34 weeks gestation of pregnancy: Secondary | ICD-10-CM

## 2019-03-11 DIAGNOSIS — I471 Supraventricular tachycardia: Secondary | ICD-10-CM

## 2019-03-11 LAB — POCT URINALYSIS DIPSTICK OB
Bilirubin, UA: NEGATIVE
Blood, UA: NEGATIVE
Glucose, UA: NEGATIVE
Ketones, UA: NEGATIVE
Leukocytes, UA: NEGATIVE
Nitrite, UA: NEGATIVE
POC,PROTEIN,UA: NEGATIVE
Spec Grav, UA: 1.015 (ref 1.010–1.025)
Urobilinogen, UA: 0.2 E.U./dL
pH, UA: 5 (ref 5.0–8.0)

## 2019-03-11 NOTE — Progress Notes (Signed)
ROB doing well. Feels good movement. Pt has had episode of tachycardia that have caused SOB and feeling dizzy. She was told by cardiologist th contact them if it continued. She did and was told that she would be scheduled for halter monitor but has not been scheduled to date. Will place another referral today. PT reviewed red flag symptoms and instructed to go to ED for evaluation if it occurs again. She verbalizes and agrees to plan of care. Discussed GBS test at next visit. Follow up 2 wk.   Doreene Burke, CNM

## 2019-03-11 NOTE — Telephone Encounter (Signed)
Crystal,   Thanks so much for taking care of this. I appreciate it.   Pattricia Boss

## 2019-03-11 NOTE — Patient Instructions (Signed)
Group B Streptococcus Infection During Pregnancy °Group B Streptococcus (GBS) is a type of bacteria that is often found in healthy people. It is commonly found in the rectum, vagina, and intestines. In people who are healthy and not pregnant, the bacteria rarely cause serious illness or complications. However, women who test positive for GBS during pregnancy can pass the bacteria to the baby during childbirth. This can cause serious infection in the baby after birth. °Women with GBS may also have infections during their pregnancy or soon after childbirth. The infections include urinary tract infections (UTIs) or infections of the uterus. GBS also increases a woman's risk of complications during pregnancy, such as early labor or delivery, miscarriage, or stillbirth. Routine testing for GBS is recommended for all pregnant women. °What are the causes? °This condition is caused by bacteria called Streptococcus agalactiae. °What increases the risk? °You may have a higher risk for GBS infection during pregnancy if you had one during a past pregnancy. °What are the signs or symptoms? °In most cases, GBS infection does not cause symptoms in pregnant women. If symptoms exist, they may include: °· Labor that starts before the 37th week of pregnancy. °· A UTI or bladder infection. This may cause a fever, frequent urination, or pain and burning during urination. °· Fever during labor. There can also be a rapid heartbeat in the mother or baby. °Rare but serious symptoms of a GBS infection in women include: °· Blood infection (septicemia). This may cause fever, chills, or confusion. °· Lung infection (pneumonia). This may cause fever, chills, cough, rapid breathing, chest pain, or difficulty breathing. °· Bone, joint, skin, or soft tissue infection. °How is this diagnosed? °You may be screened for GBS between week 35 and week 37 of pregnancy. If you have symptoms of preterm labor, you may be screened earlier. This condition is  diagnosed based on lab test results from: °· A swab of fluid from the vagina and rectum. °· A urine sample. °How is this treated? °This condition is treated with antibiotic medicine. Antibiotic medicine may be given: °· To you when you go into labor, or as soon as your water breaks. The medicines will continue until after you give birth. If you are having a cesarean delivery, you do not need antibiotics unless your water has broken. °· To your baby, if he or she requires treatment. Your health care provider will check your baby to decide if he or she needs antibiotics to prevent a serious infection. °Follow these instructions at home: °· Take over-the-counter and prescription medicines only as told by your health care provider. °· Take your antibiotic medicine as told by your health care provider. Do not stop taking the antibiotic even if you start to feel better. °· Keep all pre-birth (prenatal) visits and follow-up visits as told by your health care provider. This is important. °Contact a health care provider if: °· You have pain or burning when you urinate. °· You have to urinate more often than usual. °· You have a fever or chills. °· You develop a bad-smelling vaginal discharge. °Get help right away if: °· Your water breaks. °· You go into labor. °· You have severe pain in your abdomen. °· You have difficulty breathing. °· You have chest pain. °These symptoms may represent a serious problem that is an emergency. Do not wait to see if the symptoms will go away. Get medical help right away. Call your local emergency services (911 in the [place] Do not drive yourself to   the hospital. °Summary °· GBS is a type of bacteria that is common in healthy people. °· During pregnancy, colonization with GBS can cause serious complications for you or your baby. °· Your health care provider will screen you between 35 and 37 weeks of pregnancy to determine if you are colonized with GBS. °· If you are colonized with GBS during  pregnancy, your health care provider will recommend antibiotics through an IV during labor. °· After delivery, your baby will be evaluated for complications related to potential GBS infection and may require antibiotics to prevent a serious infection. °This information is not intended to replace advice given to you by your health care provider. Make sure you discuss any questions you have with your health care provider. °Document Revised: 07/28/2018 Document Reviewed: 07/28/2018 °Elsevier Patient Education © 2020 Elsevier Inc. ° °

## 2019-03-11 NOTE — Telephone Encounter (Signed)
Contacted [facility] in [place] to verify patients referral status. Spoke to referral coordinator who stated patient was last seen 10/2017 and was referred to [facility]. Reached out them spoke to [place] and stated patient did not follow up. I offered to send over an updated referral she stated the referral was currently active and provided me with a direct number for patient to call. I have also reached out to the patient and advised her of the following. Patient was provided with [facility]'s direct scheduling number [phone] Option 2 patient stated she would be calling them to schedule. Patient understood and is to call me directly should she have any issues scheduling.

## 2019-03-12 NOTE — Telephone Encounter (Signed)
Maggie gets all the credit. She took care of this for you.   Great job, Maggie!! AT is very appreciative.   CM

## 2019-03-25 ENCOUNTER — Encounter: Payer: Self-pay | Admitting: Certified Nurse Midwife

## 2019-03-25 ENCOUNTER — Other Ambulatory Visit: Payer: Self-pay

## 2019-03-25 ENCOUNTER — Ambulatory Visit (INDEPENDENT_AMBULATORY_CARE_PROVIDER_SITE_OTHER): Payer: 59 | Admitting: Certified Nurse Midwife

## 2019-03-25 VITALS — BP 136/86 | HR 104 | Wt 203.2 lb

## 2019-03-25 DIAGNOSIS — Z3403 Encounter for supervision of normal first pregnancy, third trimester: Secondary | ICD-10-CM

## 2019-03-25 LAB — POCT URINALYSIS DIPSTICK OB
Bilirubin, UA: NEGATIVE
Blood, UA: NEGATIVE
Glucose, UA: NEGATIVE
Ketones, UA: NEGATIVE
Leukocytes, UA: NEGATIVE
Nitrite, UA: NEGATIVE
POC,PROTEIN,UA: NEGATIVE
Spec Grav, UA: 1.01 (ref 1.010–1.025)
Urobilinogen, UA: 0.2 E.U./dL
pH, UA: 5 (ref 5.0–8.0)

## 2019-03-25 NOTE — Patient Instructions (Signed)
Group B Streptococcus Infection During Pregnancy °Group B Streptococcus (GBS) is a type of bacteria that is often found in healthy people. It is commonly found in the rectum, vagina, and intestines. In people who are healthy and not pregnant, the bacteria rarely cause serious illness or complications. However, women who test positive for GBS during pregnancy can pass the bacteria to the baby during childbirth. This can cause serious infection in the baby after birth. °Women with GBS may also have infections during their pregnancy or soon after childbirth. The infections include urinary tract infections (UTIs) or infections of the uterus. GBS also increases a woman's risk of complications during pregnancy, such as early labor or delivery, miscarriage, or stillbirth. Routine testing for GBS is recommended for all pregnant women. °What are the causes? °This condition is caused by bacteria called Streptococcus agalactiae. °What increases the risk? °You may have a higher risk for GBS infection during pregnancy if you had one during a past pregnancy. °What are the signs or symptoms? °In most cases, GBS infection does not cause symptoms in pregnant women. If symptoms exist, they may include: °· Labor that starts before the 37th week of pregnancy. °· A UTI or bladder infection. This may cause a fever, frequent urination, or pain and burning during urination. °· Fever during labor. There can also be a rapid heartbeat in the mother or baby. °Rare but serious symptoms of a GBS infection in women include: °· Blood infection (septicemia). This may cause fever, chills, or confusion. °· Lung infection (pneumonia). This may cause fever, chills, cough, rapid breathing, chest pain, or difficulty breathing. °· Bone, joint, skin, or soft tissue infection. °How is this diagnosed? °You may be screened for GBS between week 35 and week 37 of pregnancy. If you have symptoms of preterm labor, you may be screened earlier. This condition is  diagnosed based on lab test results from: °· A swab of fluid from the vagina and rectum. °· A urine sample. °How is this treated? °This condition is treated with antibiotic medicine. Antibiotic medicine may be given: °· To you when you go into labor, or as soon as your water breaks. The medicines will continue until after you give birth. If you are having a cesarean delivery, you do not need antibiotics unless your water has broken. °· To your baby, if he or she requires treatment. Your health care provider will check your baby to decide if he or she needs antibiotics to prevent a serious infection. °Follow these instructions at home: °· Take over-the-counter and prescription medicines only as told by your health care provider. °· Take your antibiotic medicine as told by your health care provider. Do not stop taking the antibiotic even if you start to feel better. °· Keep all pre-birth (prenatal) visits and follow-up visits as told by your health care provider. This is important. °Contact a health care provider if: °· You have pain or burning when you urinate. °· You have to urinate more often than usual. °· You have a fever or chills. °· You develop a bad-smelling vaginal discharge. °Get help right away if: °· Your water breaks. °· You go into labor. °· You have severe pain in your abdomen. °· You have difficulty breathing. °· You have chest pain. °These symptoms may represent a serious problem that is an emergency. Do not wait to see if the symptoms will go away. Get medical help right away. Call your local emergency services (911 in the [place] Do not drive yourself to   the hospital. °Summary °· GBS is a type of bacteria that is common in healthy people. °· During pregnancy, colonization with GBS can cause serious complications for you or your baby. °· Your health care provider will screen you between 35 and 37 weeks of pregnancy to determine if you are colonized with GBS. °· If you are colonized with GBS during  pregnancy, your health care provider will recommend antibiotics through an IV during labor. °· After delivery, your baby will be evaluated for complications related to potential GBS infection and may require antibiotics to prevent a serious infection. °This information is not intended to replace advice given to you by your health care provider. Make sure you discuss any questions you have with your health care provider. °Document Revised: 07/28/2018 Document Reviewed: 07/28/2018 °Elsevier Patient Education © 2020 Elsevier Inc. ° °

## 2019-03-25 NOTE — Progress Notes (Signed)
ROB doing well. Feels good movement. Followed up with cardiology and is wearing halter monitor today. GBS and cultures collected. She has +1 bilateral pitting edema that is causing discomfort. Reviewed S&S of pre E she denies symptoms today . Reflexes +1 bilateral, negative clonus, no headaches or visual changes. Herbal prep handout given.  Follow up 1 wk with Marcelino Duster.   Doreene Burke, CNM

## 2019-03-27 LAB — STREP GP B NAA: Strep Gp B NAA: NEGATIVE

## 2019-03-29 LAB — GC/CHLAMYDIA PROBE AMP
Chlamydia trachomatis, NAA: NEGATIVE
Neisseria Gonorrhoeae by PCR: NEGATIVE

## 2019-04-02 ENCOUNTER — Encounter: Payer: 59 | Admitting: Certified Nurse Midwife

## 2019-04-02 ENCOUNTER — Other Ambulatory Visit: Payer: Self-pay

## 2019-04-02 ENCOUNTER — Ambulatory Visit (INDEPENDENT_AMBULATORY_CARE_PROVIDER_SITE_OTHER): Payer: 59 | Admitting: Certified Nurse Midwife

## 2019-04-02 VITALS — BP 121/73 | HR 85 | Wt 204.3 lb

## 2019-04-02 DIAGNOSIS — Z3A37 37 weeks gestation of pregnancy: Secondary | ICD-10-CM

## 2019-04-02 LAB — POCT URINALYSIS DIPSTICK OB
Bilirubin, UA: NEGATIVE
Blood, UA: NEGATIVE
Glucose, UA: NEGATIVE
Ketones, UA: NEGATIVE
Leukocytes, UA: NEGATIVE
Nitrite, UA: NEGATIVE
POC,PROTEIN,UA: NEGATIVE
Spec Grav, UA: 1.01 (ref 1.010–1.025)
Urobilinogen, UA: 0.2 E.U./dL
pH, UA: 6 (ref 5.0–8.0)

## 2019-04-02 NOTE — Patient Instructions (Addendum)
Vaginal Delivery  Vaginal delivery means that you give birth by pushing your baby out of your birth canal (vagina). A team of health care providers will help you before, during, and after vaginal delivery. Birth experiences are unique for every woman and every pregnancy, and birth experiences vary depending on where you choose to give birth. What happens when I arrive at the birth center or hospital? Once you are in labor and have been admitted into the hospital or birth center, your health care provider may:  Review your pregnancy history and any concerns that you have.  Insert an IV into one of your veins. This may be used to give you fluids and medicines.  Check your blood pressure, pulse, temperature, and heart rate (vital signs).  Check whether your bag of water (amniotic sac) has broken (ruptured).  Talk with you about your birth plan and discuss pain control options. Monitoring Your health care provider may monitor your contractions (uterine monitoring) and your baby's heart rate (fetal monitoring). You may need to be monitored:  Often, but not continuously (intermittently).  All the time or for long periods at a time (continuously). Continuous monitoring may be needed if: ? You are taking certain medicines, such as medicine to relieve pain or make your contractions stronger. ? You have pregnancy or labor complications. Monitoring may be done by:  Placing a special stethoscope or a handheld monitoring device on your abdomen to check your baby's heartbeat and to check for contractions.  Placing monitors on your abdomen (external monitors) to record your baby's heartbeat and the frequency and length of contractions.  Placing monitors inside your uterus through your vagina (internal monitors) to record your baby's heartbeat and the frequency, length, and strength of your contractions. Depending on the type of monitor, it may remain in your uterus or on your baby's head until  birth.  Telemetry. This is a type of continuous monitoring that can be done with external or internal monitors. Instead of having to stay in bed, you are able to move around during telemetry. Physical exam Your health care provider may perform frequent physical exams. This may include:  Checking how and where your baby is positioned in your uterus.  Checking your cervix to determine: ? Whether it is thinning out (effacing). ? Whether it is opening up (dilating). What happens during labor and delivery?  Normal labor and delivery is divided into the following three stages: Stage 1  This is the longest stage of labor.  This stage can last for hours or days.  Throughout this stage, you will feel contractions. Contractions generally feel mild, infrequent, and irregular at first. They get stronger, more frequent (about every 2-3 minutes), and more regular as you move through this stage.  This stage ends when your cervix is completely dilated to 4 inches (10 cm) and completely effaced. Stage 2  This stage starts once your cervix is completely effaced and dilated and lasts until the delivery of your baby.  This stage may last from 20 minutes to 2 hours.  This is the stage where you will feel an urge to push your baby out of your vagina.  You may feel stretching and burning pain, especially when the widest part of your baby's head passes through the vaginal opening (crowning).  Once your baby is delivered, the umbilical cord will be clamped and cut. This usually occurs after waiting a period of 1-2 minutes after delivery.  Your baby will be placed on your bare chest (  skin-to-skin contact) in an upright position and covered with a warm blanket. Watch your baby for feeding cues, like rooting or sucking, and help the baby to your breast for his or her first feeding. Stage 3  This stage starts immediately after the birth of your baby and ends after you deliver the placenta.  This stage may  take anywhere from 5 to 30 minutes.  After your baby has been delivered, you will feel contractions as your body expels the placenta and your uterus contracts to control bleeding. What can I expect after labor and delivery?  After labor is over, you and your baby will be monitored closely until you are ready to go home to ensure that you are both healthy. Your health care team will teach you how to care for yourself and your baby.  You and your baby will stay in the same room (rooming in) during your hospital stay. This will encourage early bonding and successful breastfeeding.  You may continue to receive fluids and medicines through an IV.  Your uterus will be checked and massaged regularly (fundal massage).  You will have some soreness and pain in your abdomen, vagina, and the area of skin between your vaginal opening and your anus (perineum).  If an incision was made near your vagina (episiotomy) or if you had some vaginal tearing during delivery, cold compresses may be placed on your episiotomy or your tear. This helps to reduce pain and swelling.  You may be given a squirt bottle to use instead of wiping when you go to the bathroom. To use the squirt bottle, follow these steps: ? Before you urinate, fill the squirt bottle with warm water. Do not use hot water. ? After you urinate, while you are sitting on the toilet, use the squirt bottle to rinse the area around your urethra and vaginal opening. This rinses away any urine and blood. ? Fill the squirt bottle with clean water every time you use the bathroom.  It is normal to have vaginal bleeding after delivery. Wear a sanitary pad for vaginal bleeding and discharge. Summary  Vaginal delivery means that you will give birth by pushing your baby out of your birth canal (vagina).  Your health care provider may monitor your contractions (uterine monitoring) and your baby's heart rate (fetal monitoring).  Your health care provider may  perform a physical exam.  Normal labor and delivery is divided into three stages.  After labor is over, you and your baby will be monitored closely until you are ready to go home. This information is not intended to replace advice given to you by your health care provider. Make sure you discuss any questions you have with your health care provider. Document Revised: 02/05/2017 Document Reviewed: 02/05/2017 Elsevier Patient Education  2020 Elsevier Inc.    Fetal Movement Counts Patient Name: ________________________________________________ Patient Due Date: ____________________ What is a fetal movement count?  A fetal movement count is the number of times that you feel your baby move during a certain amount of time. This may also be called a fetal kick count. A fetal movement count is recommended for every pregnant woman. You may be asked to start counting fetal movements as early as week 28 of your pregnancy. Pay attention to when your baby is most active. You may notice your baby's sleep and wake cycles. You may also notice things that make your baby move more. You should do a fetal movement count:  When your baby is normally most   At the same time each day. A good time to count movements is while you are resting, after having something to eat and drink. How do I count fetal movements? 1. Find a quiet, comfortable area. Sit, or lie down on your side. 2. Write down the date, the start time and stop time, and the number of movements that you felt between those two times. Take this information with you to your health care visits. 3. Write down your start time when you feel the first movement. 4. Count kicks, flutters, swishes, rolls, and jabs. You should feel at least 10 movements. 5. You may stop counting after you have felt 10 movements, or if you have been counting for 2 hours. Write down the stop time. 6. If you do not feel 10 movements in 2 hours, contact your health care  provider for further instructions. Your health care provider may want to do additional tests to assess your baby's well-being. Contact a health care provider if:  You feel fewer than 10 movements in 2 hours.  Your baby is not moving like he or she usually does. Date: ____________ Start time: ____________ Stop time: ____________ Movements: ____________ Date: ____________ Start time: ____________ Stop time: ____________ Movements: ____________ Date: ____________ Start time: ____________ Stop time: ____________ Movements: ____________ Date: ____________ Start time: ____________ Stop time: ____________ Movements: ____________ Date: ____________ Start time: ____________ Stop time: ____________ Movements: ____________ Date: ____________ Start time: ____________ Stop time: ____________ Movements: ____________ Date: ____________ Start time: ____________ Stop time: ____________ Movements: ____________ Date: ____________ Start time: ____________ Stop time: ____________ Movements: ____________ Date: ____________ Start time: ____________ Stop time: ____________ Movements: ____________ This information is not intended to replace advice given to you by your health care provider. Make sure you discuss any questions you have with your health care provider. Document Revised: 08/21/2018 Document Reviewed: 08/21/2018 Elsevier Patient Education  2020 [employer].  Pregnancy and Travel  Most pregnant women can safely travel until the last month of their pregnancy. Your doctor may recommend limiting or avoiding travel depending on how far you are in the pregnancy, and if you have any medical or pregnancy problems. General travel tips Before you go:  Discuss your trip with your doctor. Get examined shortly before you go.  Get a copy of your medical records. Take it with you.  Try to get names of doctors and hospitals in the area where you will be visiting.  Pack your pillow.  Pack any approved medicines  and supplements.  Get enough sleep the night before the trip. During your trip:  Ask for locations of doctors and hospitals.  Wear flat, comfortable shoes.  Wear loose-fitting, comfortable clothes.  Wear compression stockings as told by your doctor. They may prevent blood clots that arise from sitting for a long time.  Do leg exercises as told by your doctor.  Eat a balanced diet, drink lots of fluid, and take your vitamins and supplements.  Take water, crackers, and fruit with you.  Take breaks to use the restroom and walk every 2 hours or during stops.  Do not wear yourself out.  Do not ride on a motorcycle.  Rest. If your trip is long, lie down for 30 or more minutes with your feet slightly raised after you reach your destination.  Always wear a seat belt. Tips for traveling to a foreign country Before you go:  Ask your doctor if there are medicines that are safe for you to take if you get diarrhea, constipation, nausea,  or vomiting.  Check with your health insurance provider about medical coverage abroad. Purchase travel [employer], if needed.  Make sure you are up to date on vaccines. During your trip:  Do not eat uncooked foods.  Do not eat food from buffets or food that is cold or sitting at room temperature.  Drink bottled beverages and water. Do not use ice.  Wash fruits and vegetables with clean water. If possible, peel them before eating.  Do not drink unpasteurized milk.  Wear insect repellent if there are mosquitoes or other biting insects. Ask your doctor which repellents are safe. What do I need to know about traveling by car?  Wear your seat belt properly. The belt should be buckled below your abdomen, on your hip bones. The shoulder belt should be off to the side of your abdomen and across the center of your chest.  If you are in the front seat, sit as far away from the dashboard as possible to avoid getting hit hard if the airbag deploys  in an accident.  Do not travel for more than 5-6 hours a day. What do I need to know about traveling by bus?  Before making a reservation, ask whether your bus will have a restroom.  Move your arms and legs when seated.  If you have to use the restroom, hold on to the seats and handrails as you walk.  Do not travel for more than 5-6 hours a day. What do I need to know about traveling by train?  Before making a reservation, ask if your train will have a sleeping car and more than one restroom.  If you need to walk while the train is moving, hold on to seats and handrails.  Move your arms and legs when seated.  Do not travel for more than 5-6 hours a day. What do I need to know about traveling by airplane?  Before booking your trip, ask about the airline's rules about pregnancy. Pregnant women may be restricted from [place] after a certain time of the pregnancy. Every airline has its own rules.  Make sure you complete your trip before 36 weeks of pregnancy.  Ask whether the airplane cabin will be pressurized. Do not board an unpressurized plane that will fly above 7,000 ft (2,100 m).  Try to get a bulkhead or an aisle seat so it is easier to get up, stretch, and use the bathroom.  Wear layers since the cabin temperature can change.  Put all your medicines and medical records in your carry-on bag.  Avoid drinking caffeinated or carbonated beverages.  Avoid eating foods that may make you bloated.  Do not eat a big meal.  If you need to walk through the airplane, hold on to the seats and handrails.  Move your arms and legs when seated.  Wear your seat belt. What do I need to know about traveling by cruise ship?  Before booking your trip, ask the [place]: ? Are pregnant women allowed on the ship? ? Is there a medical facility and doctor on board? ? Does the ship dock in places where there are doctors and medical facilities?  Before booking your trip, ask your  doctor: ? Is it safe to take medicines if I get seasick? ? Is it safe to wear acupressure wristbands to prevent seasickness? If the answer is yes, consider buying one. Contact a health care provider if:  You have diarrhea.  You vomit.  You have nausea or seasickness.  Get help right away if:  You have vaginal bleeding.  You have severe vomiting or diarrhea.  You have pelvic or abdominal pain.  You have contractions.  Your water breaks.  You have a persistent headache.  Your eyesight changes or you see spots.  Your face or hands are swollen.  You have pain, warmth, or swelling in your legs or ankles. Summary  Most pregnant women can safely travel until the last month of their pregnancy. Your doctor may tell you to limit or avoid travel depending on how far you are in your pregnancy and if you have any medical or pregnancy problems.  The best time to travel is between 14 and 28 weeks of your pregnancy.  Before you go on your trip, make sure you discuss your trip with your health care provider, get a copy of your medical records, and try to get information on medical centers and doctors at your destination.  While on your trip, make sure you wear comfortable clothes and shoes, eat a healthy diet, drink plenty of fluids, take your vitamins and supplements, take breaks and rest often, and wear your seat belt.  Before booking a flight, ask about the airline's rules about pregnancy. Every airline has its own rules. Do the same for a train, bus, or cruise ship. This information is not intended to replace advice given to you by your health care provider. Make sure you discuss any questions you have with your health care provider. Document Revised: 06/17/2018 Document Reviewed: 02/07/2016 Elsevier Patient Education  2020 [employer].

## 2019-04-02 NOTE — Progress Notes (Signed)
ROB-Patient wanting to go out of town, wondering if this is ok?

## 2019-04-03 NOTE — Progress Notes (Signed)
ROB-Questions answered regarding travel in pregnancy, see AVS. Dicussed home treatment measures for swelling in pregnancy. Anticipatory guidance regarding course of prenatal care. Reviewed red flag symptoms and when to call. RTC x 1 week for ROB or sooner if needed.

## 2019-04-06 ENCOUNTER — Telehealth: Payer: Self-pay

## 2019-04-06 ENCOUNTER — Other Ambulatory Visit: Payer: Self-pay

## 2019-04-06 ENCOUNTER — Observation Stay
Admission: EM | Admit: 2019-04-06 | Discharge: 2019-04-06 | Disposition: A | Discharge status: Home / Self Care | Payer: 59 | Attending: Obstetrics and Gynecology | Admitting: Obstetrics and Gynecology

## 2019-04-06 ENCOUNTER — Telehealth: Payer: Self-pay | Admitting: Certified Nurse Midwife

## 2019-04-06 ENCOUNTER — Encounter: Payer: Self-pay | Admitting: Obstetrics and Gynecology

## 2019-04-06 DIAGNOSIS — M7989 Other specified soft tissue disorders: Secondary | ICD-10-CM | POA: Insufficient documentation

## 2019-04-06 DIAGNOSIS — R519 Headache, unspecified: Secondary | ICD-10-CM | POA: Diagnosis not present

## 2019-04-06 DIAGNOSIS — H538 Other visual disturbances: Secondary | ICD-10-CM | POA: Insufficient documentation

## 2019-04-06 DIAGNOSIS — Z3A38 38 weeks gestation of pregnancy: Secondary | ICD-10-CM | POA: Diagnosis not present

## 2019-04-06 DIAGNOSIS — O26893 Other specified pregnancy related conditions, third trimester: Secondary | ICD-10-CM | POA: Diagnosis present

## 2019-04-06 DIAGNOSIS — R1011 Right upper quadrant pain: Secondary | ICD-10-CM | POA: Diagnosis not present

## 2019-04-06 LAB — COMPREHENSIVE METABOLIC PANEL
ALT: 12 U/L (ref 0–44)
AST: 18 U/L (ref 15–41)
Albumin: 2.8 g/dL — ABNORMAL LOW (ref 3.5–5.0)
Alkaline Phosphatase: 164 U/L — ABNORMAL HIGH (ref 38–126)
Anion gap: 9 (ref 5–15)
BUN: 5 mg/dL — ABNORMAL LOW (ref 6–20)
CO2: 21 mmol/L — ABNORMAL LOW (ref 22–32)
Calcium: 8.5 mg/dL — ABNORMAL LOW (ref 8.9–10.3)
Chloride: 107 mmol/L (ref 98–111)
Creatinine, Ser: 0.54 mg/dL (ref 0.44–1.00)
GFR calc Af Amer: >60 mL/min (ref >60)
GFR calc non Af Amer: >60 mL/min (ref >60)
Glucose, Bld: 76 mg/dL (ref 70–99)
Potassium: 4.1 mmol/L (ref 3.5–5.1)
Sodium: 137 mmol/L (ref 135–145)
Total Bilirubin: 0.5 mg/dL (ref 0.3–1.2)
Total Protein: 6.3 g/dL — ABNORMAL LOW (ref 6.5–8.1)

## 2019-04-06 LAB — PROTEIN / CREATININE RATIO, URINE
Creatinine, Urine: 39 mg/dL
Protein Creatinine Ratio: 0.18 mg/mg{Cre} — ABNORMAL HIGH (ref 0.00–0.15)
Total Protein, Urine: 7 mg/dL

## 2019-04-06 LAB — CBC
HCT: 34.1 % — ABNORMAL LOW (ref 36.0–46.0)
Hemoglobin: 11 g/dL — ABNORMAL LOW (ref 12.0–15.0)
MCH: 28.1 pg (ref 26.0–34.0)
MCHC: 32.3 g/dL (ref 30.0–36.0)
MCV: 87.2 fL (ref 80.0–100.0)
Platelets: 257 10*3/uL (ref 150–400)
RBC: 3.91 MIL/uL (ref 3.87–5.11)
RDW: 13.3 % (ref 11.5–15.5)
WBC: 9.6 10*3/uL (ref 4.0–10.5)
nRBC: 0 % (ref 0.0–0.2)

## 2019-04-06 NOTE — Discharge Summary (Signed)
Obstetric Discharge Summary  Patient ID: Tracy Stevens MRN: [id number] DOB/AGE: 1995-03-02 24 y.o.   Date of Admission: 04/06/2019  Date of Discharge: 04/06/2019  Admitting Diagnosis: Observation at [redacted]w[redacted]d     Discharge Diagnosis: No other diagnosis   Antepartum Procedures: NST   Brief Hospital Course   L&D OB Triage Note  SHIMIKA AMES is a 24 y.o. G1P0 female at [redacted]w[redacted]d, EDD Estimated Date of Delivery: 04/18/19 who presented to triage for complaints of blurred vision, flashes of light, swelling in feet/ankles, RUQ, and headache.    She was evaluated by the nurses with no significant findings for acute abdomen, pre-eclampsia, labor, or fetal distress.   Vital signs stable. An NST was performed and has been reviewed by CNM.    NST INTERPRETATION: Indications: rule out uterine contractions  Mode: External Baseline Rate (A): 120 bpm Variability: Moderate Accelerations: 15 x 15 Decelerations: None Contraction Frequency (min): 2.5-4  Impression: reactive  CBC Latest Ref Rng & Units 04/06/2019 01/23/2019 07/11/2012  WBC 4.0 - 10.5 K/uL 9.6 10.7 7.5  Hemoglobin 12.0 - 15.0 g/dL 11.0(L) 11.5 14.0  Hematocrit 36.0 - 46.0 % 34.1(L) 33.8(L) 40.7  Platelets 150 - 400 K/uL 257 266 307   CMP Latest Ref Rng & Units 04/06/2019 07/11/2012  Glucose 70 - 99 mg/dL 76 89  BUN 6 - 20 mg/dL 5(L) -  Creatinine 3.82 - 1.00 mg/dL 5.05 -  Sodium 397 - 673 mmol/L 137 -  Potassium 3.5 - 5.1 mmol/L 4.1 -  Chloride 98 - 111 mmol/L 107 -  CO2 22 - 32 mmol/L 21(L) -  Calcium 8.9 - 10.3 mg/dL 4.1(P) -  Total Protein 6.5 - 8.1 g/dL 6.3(L) -  Total Bilirubin 0.3 - 1.2 mg/dL 0.5 -  Alkaline Phos 38 - 126 U/L 164(H) -  AST 15 - 41 U/L 18 -  ALT 0 - 44 U/L 12 -   Urine Protein Creatinine Ratio: 0.18  Plan: NST performed was reviewed and was found to be reactive. She was discharged home with bleeding/labor precautions.  Continue routine prenatal care. Follow up with CNM as previously scheduled.     Discharge Instructions: Per After Visit Summary.  Activity:  Also refer to After Visit Summary.  Diet: Regular  Medications: Allergies as of 04/06/2019   No Known Allergies     Medication List    TAKE these medications   acetaminophen 325 MG tablet Commonly known as: TYLENOL Take 650 mg by mouth every 6 (six) hours as needed.   prenatal multivitamin Tabs tablet Take 1 tablet by mouth daily at 12 noon.      Postpartum contraception: condoms  Discharged Condition: stable  Discharged to: home   Gunnar Bulla, CNM [facility], [facility] 04/06/19 9:28 PM

## 2019-04-06 NOTE — Telephone Encounter (Signed)
Patient called again to follow up with a  nurse to call her about her headaches.

## 2019-04-06 NOTE — Telephone Encounter (Signed)
She needs to be seen, given that it is the end of the day please advise her to go to the hospital for evaluation.   Thanks,  Pattricia Boss

## 2019-04-06 NOTE — Telephone Encounter (Signed)
Pt called in and stated that she is having bad headaches the pt said she is taking tylenol but its not helping. The Pt also stated that she is seeing little while dots every now and then. Her feet are still swelling.  The pt is requesting a call back. Please advise

## 2019-04-06 NOTE — Telephone Encounter (Signed)
Spoke with patient- she states she had a migraine and had some visual changes with it- spots before her eyes. She states her feet are very swollen. She states her headache is gone but the swelling is not. She was strongly encouraged to report to the ED for a BP check and pre-eclampsia labs. She was encouraged to increase her fluid intake and prop her feet whenever sitting. Patient expressed understanding and will go to ED.

## 2019-04-06 NOTE — OB Triage Note (Signed)
Pt is a 23y/o G1P0 at [redacted]w[redacted]d with c/o spots in vision like flashes of light, and swelling in her feet/ ankles. Pt stated waking up with severe headaches that past two nights that last about an hour. Pt mentioned RUQ pain noted but thought to be baby movement. Pt states +FM. Pt denies LOF, CTX and VB. Monitors applied and assessing. Initial FHT 140.

## 2019-04-06 NOTE — Progress Notes (Signed)
Pt denies current visual changes, headache and pain of any kind. Pt is unaware of contractions, which are palpated mild. Pt to be d/c home and to self care. Pt given teaching on when to seek medical attention (LOF, VB and decreased FM, etc..). Pt given information regarding elevated BP's and signs of PreE. Pt verbalized understanding of d/c instructions.

## 2019-04-07 ENCOUNTER — Telehealth: Payer: Self-pay

## 2019-04-07 NOTE — Telephone Encounter (Signed)
Called patient back no answer 

## 2019-04-07 NOTE — Telephone Encounter (Signed)
mychart message sent to patient

## 2019-04-07 NOTE — Telephone Encounter (Signed)
Patient called returning a missed call from nurse.

## 2019-04-08 ENCOUNTER — Other Ambulatory Visit: Payer: Self-pay

## 2019-04-08 ENCOUNTER — Encounter: Payer: Self-pay | Admitting: Certified Nurse Midwife

## 2019-04-08 ENCOUNTER — Ambulatory Visit (INDEPENDENT_AMBULATORY_CARE_PROVIDER_SITE_OTHER): Payer: 59 | Admitting: Certified Nurse Midwife

## 2019-04-08 VITALS — BP 125/76 | HR 76 | Wt 204.2 lb

## 2019-04-08 DIAGNOSIS — Z3A38 38 weeks gestation of pregnancy: Secondary | ICD-10-CM

## 2019-04-08 LAB — POCT URINALYSIS DIPSTICK OB
Bilirubin, UA: NEGATIVE
Blood, UA: NEGATIVE
Glucose, UA: NEGATIVE
Ketones, UA: NEGATIVE
Leukocytes, UA: NEGATIVE
Nitrite, UA: NEGATIVE
POC,PROTEIN,UA: NEGATIVE
Spec Grav, UA: 1.01 (ref 1.010–1.025)
Urobilinogen, UA: 0.2 E.U./dL
pH, UA: 5 (ref 5.0–8.0)

## 2019-04-08 NOTE — Progress Notes (Signed)
ROB doing well. Feels good movement. Pt states she has occasionally speck in her eyes and has episodes of headaches. She was evaluated at the hospital the other night and her labs were normal. Her exam today was also normal. Discussed red flag symptoms and pt encouraged to notify us of any changes. SVE 1/50%/-2. Note given to pt to be out of work for remainder of pregnancy. Follow up 1 wk or ssoner.   Doreene Burke, CNM

## 2019-04-08 NOTE — Patient Instructions (Signed)
Braxton Hicks Contractions °Contractions of the uterus can occur throughout pregnancy, but they are not always a sign that you are in labor. You may have practice contractions called Braxton Hicks contractions. These false labor contractions are sometimes confused with true labor. °What are Braxton Hicks contractions? °Braxton Hicks contractions are tightening movements that occur in the muscles of the uterus before labor. Unlike true labor contractions, these contractions do not result in opening (dilation) and thinning of the cervix. Toward the end of pregnancy (32-34 weeks), Braxton Hicks contractions can happen more often and may become stronger. These contractions are sometimes difficult to tell apart from true labor because they can be very uncomfortable. You should not feel embarrassed if you go to the hospital with false labor. °Sometimes, the only way to tell if you are in true labor is for your health care provider to look for changes in the cervix. The health care provider will do a physical exam and may monitor your contractions. If you are not in true labor, the exam should show that your cervix is not dilating and your water has not broken. °If there are no other health problems associated with your pregnancy, it is completely safe for you to be sent home with false labor. You may continue to have Braxton Hicks contractions until you go into true labor. °How to tell the difference between true labor and false labor °True labor °· Contractions last 30-70 seconds. °· Contractions become very regular. °· Discomfort is usually felt in the top of the uterus, and it spreads to the lower abdomen and low back. °· Contractions do not go away with walking. °· Contractions usually become more intense and increase in frequency. °· The cervix dilates and gets thinner. °False labor °· Contractions are usually shorter and not as strong as true labor contractions. °· Contractions are usually irregular. °· Contractions  are often felt in the front of the lower abdomen and in the groin. °· Contractions may go away when you walk around or change positions while lying down. °· Contractions get weaker and are shorter-lasting as time goes on. °· The cervix usually does not dilate or become thin. °Follow these instructions at home: ° °· Take over-the-counter and prescription medicines only as told by your health care provider. °· Keep up with your usual exercises and follow other instructions from your health care provider. °· Eat and drink lightly if you think you are going into labor. °· If Braxton Hicks contractions are making you uncomfortable: °? Change your position from lying down or resting to walking, or change from walking to resting. °? Sit and rest in a tub of warm water. °? Drink enough fluid to keep your urine pale yellow. Dehydration may cause these contractions. °? Do slow and deep breathing several times an hour. °· Keep all follow-up prenatal visits as told by your health care provider. This is important. °Contact a health care provider if: °· You have a fever. °· You have continuous pain in your abdomen. °Get help right away if: °· Your contractions become stronger, more regular, and closer together. °· You have fluid leaking or gushing from your vagina. °· You pass blood-tinged mucus (bloody show). °· You have bleeding from your vagina. °· You have low back pain that you never had before. °· You feel your baby’s head pushing down and causing pelvic pressure. °· Your baby is not moving inside you as much as it used to. °Summary °· Contractions that occur before labor are   called Braxton Hicks contractions, false labor, or practice contractions. °· Braxton Hicks contractions are usually shorter, weaker, farther apart, and less regular than true labor contractions. True labor contractions usually become progressively stronger and regular, and they become more frequent. °· Manage discomfort from Braxton Hicks contractions  by changing position, resting in a warm bath, drinking plenty of water, or practicing deep breathing. °This information is not intended to replace advice given to you by your health care provider. Make sure you discuss any questions you have with your health care provider. °Document Revised: 12/14/2016 Document Reviewed: 05/17/2016 °Elsevier Patient Education © 2020 [employer] ° °

## 2019-04-13 ENCOUNTER — Encounter: Payer: 59 | Admitting: Certified Nurse Midwife

## 2019-04-13 ENCOUNTER — Ambulatory Visit (INDEPENDENT_AMBULATORY_CARE_PROVIDER_SITE_OTHER): Payer: 59 | Admitting: Certified Nurse Midwife

## 2019-04-13 ENCOUNTER — Encounter: Payer: Self-pay | Admitting: Certified Nurse Midwife

## 2019-04-13 ENCOUNTER — Other Ambulatory Visit: Payer: Self-pay

## 2019-04-13 VITALS — BP 125/83 | HR 77 | Wt 207.2 lb

## 2019-04-13 DIAGNOSIS — Z3A38 38 weeks gestation of pregnancy: Secondary | ICD-10-CM | POA: Diagnosis not present

## 2019-04-13 LAB — POCT URINALYSIS DIPSTICK OB
Bilirubin, UA: NEGATIVE
Blood, UA: NEGATIVE
Glucose, UA: NEGATIVE
Ketones, UA: NEGATIVE
Leukocytes, UA: NEGATIVE
Nitrite, UA: NEGATIVE
POC,PROTEIN,UA: NEGATIVE
Spec Grav, UA: 1.01 (ref 1.010–1.025)
Urobilinogen, UA: 0.2 E.U./dL
pH, UA: 6 (ref 5.0–8.0)

## 2019-04-13 NOTE — Progress Notes (Signed)
ROB doing well. Feels good movement. Denies any change with headaches or symptoms pre-e. Labor precautions reviewed. Follow up 1 wk for u/s and ROB with Marcelino Duster.   Doreene Burke, CNM

## 2019-04-13 NOTE — Patient Instructions (Signed)
Braxton Hicks Contractions °Contractions of the uterus can occur throughout pregnancy, but they are not always a sign that you are in labor. You may have practice contractions called Braxton Hicks contractions. These false labor contractions are sometimes confused with true labor. °What are Braxton Hicks contractions? °Braxton Hicks contractions are tightening movements that occur in the muscles of the uterus before labor. Unlike true labor contractions, these contractions do not result in opening (dilation) and thinning of the cervix. Toward the end of pregnancy (32-34 weeks), Braxton Hicks contractions can happen more often and may become stronger. These contractions are sometimes difficult to tell apart from true labor because they can be very uncomfortable. You should not feel embarrassed if you go to the hospital with false labor. °Sometimes, the only way to tell if you are in true labor is for your health care provider to look for changes in the cervix. The health care provider will do a physical exam and may monitor your contractions. If you are not in true labor, the exam should show that your cervix is not dilating and your water has not broken. °If there are no other health problems associated with your pregnancy, it is completely safe for you to be sent home with false labor. You may continue to have Braxton Hicks contractions until you go into true labor. °How to tell the difference between true labor and false labor °True labor °· Contractions last 30-70 seconds. °· Contractions become very regular. °· Discomfort is usually felt in the top of the uterus, and it spreads to the lower abdomen and low back. °· Contractions do not go away with walking. °· Contractions usually become more intense and increase in frequency. °· The cervix dilates and gets thinner. °False labor °· Contractions are usually shorter and not as strong as true labor contractions. °· Contractions are usually irregular. °· Contractions  are often felt in the front of the lower abdomen and in the groin. °· Contractions may go away when you walk around or change positions while lying down. °· Contractions get weaker and are shorter-lasting as time goes on. °· The cervix usually does not dilate or become thin. °Follow these instructions at home: ° °· Take over-the-counter and prescription medicines only as told by your health care provider. °· Keep up with your usual exercises and follow other instructions from your health care provider. °· Eat and drink lightly if you think you are going into labor. °· If Braxton Hicks contractions are making you uncomfortable: °? Change your position from lying down or resting to walking, or change from walking to resting. °? Sit and rest in a tub of warm water. °? Drink enough fluid to keep your urine pale yellow. Dehydration may cause these contractions. °? Do slow and deep breathing several times an hour. °· Keep all follow-up prenatal visits as told by your health care provider. This is important. °Contact a health care provider if: °· You have a fever. °· You have continuous pain in your abdomen. °Get help right away if: °· Your contractions become stronger, more regular, and closer together. °· You have fluid leaking or gushing from your vagina. °· You pass blood-tinged mucus (bloody show). °· You have bleeding from your vagina. °· You have low back pain that you never had before. °· You feel your baby’s head pushing down and causing pelvic pressure. °· Your baby is not moving inside you as much as it used to. °Summary °· Contractions that occur before labor are   called Braxton Hicks contractions, false labor, or practice contractions. °· Braxton Hicks contractions are usually shorter, weaker, farther apart, and less regular than true labor contractions. True labor contractions usually become progressively stronger and regular, and they become more frequent. °· Manage discomfort from Braxton Hicks contractions  by changing position, resting in a warm bath, drinking plenty of water, or practicing deep breathing. °This information is not intended to replace advice given to you by your health care provider. Make sure you discuss any questions you have with your health care provider. °Document Revised: 12/14/2016 Document Reviewed: 05/17/2016 °Elsevier Patient Education © 2020 [employer] ° °

## 2019-04-14 ENCOUNTER — Observation Stay
Admission: EM | Admit: 2019-04-14 | Discharge: 2019-04-14 | Disposition: A | Discharge status: Home / Self Care | Payer: 59 | Source: Home / Self Care | Admitting: Obstetrics and Gynecology

## 2019-04-14 ENCOUNTER — Telehealth: Payer: Self-pay | Admitting: Certified Nurse Midwife

## 2019-04-14 ENCOUNTER — Encounter: Payer: Self-pay | Admitting: Obstetrics and Gynecology

## 2019-04-14 ENCOUNTER — Inpatient Hospital Stay: Payer: 59 | Admitting: Anesthesiology

## 2019-04-14 ENCOUNTER — Other Ambulatory Visit: Payer: Self-pay

## 2019-04-14 ENCOUNTER — Inpatient Hospital Stay
Admission: EM | Admit: 2019-04-14 | Discharge: 2019-04-15 | DRG: 807 | Disposition: A | Discharge status: Home / Self Care | Payer: 59 | Attending: Certified Nurse Midwife | Admitting: Certified Nurse Midwife

## 2019-04-14 DIAGNOSIS — Z23 Encounter for immunization: Secondary | ICD-10-CM

## 2019-04-14 DIAGNOSIS — Z3A39 39 weeks gestation of pregnancy: Secondary | ICD-10-CM

## 2019-04-14 DIAGNOSIS — Z20822 Contact with and (suspected) exposure to covid-19: Secondary | ICD-10-CM | POA: Diagnosis present

## 2019-04-14 DIAGNOSIS — Z3490 Encounter for supervision of normal pregnancy, unspecified, unspecified trimester: Secondary | ICD-10-CM

## 2019-04-14 DIAGNOSIS — O26893 Other specified pregnancy related conditions, third trimester: Secondary | ICD-10-CM | POA: Diagnosis present

## 2019-04-14 LAB — TYPE AND SCREEN
ABO/RH(D): O POS
Antibody Screen: NEGATIVE

## 2019-04-14 LAB — CBC
HCT: 38.5 % (ref 36.0–46.0)
Hemoglobin: 12.6 g/dL (ref 12.0–15.0)
MCH: 28.1 pg (ref 26.0–34.0)
MCHC: 32.7 g/dL (ref 30.0–36.0)
MCV: 85.7 fL (ref 80.0–100.0)
Platelets: 273 10*3/uL (ref 150–400)
RBC: 4.49 MIL/uL (ref 3.87–5.11)
RDW: 13.6 % (ref 11.5–15.5)
WBC: 17.9 10*3/uL — ABNORMAL HIGH (ref 4.0–10.5)
nRBC: 0 % (ref 0.0–0.2)

## 2019-04-14 LAB — RESPIRATORY PANEL BY RT PCR (FLU A&B, COVID)
Influenza A by PCR: NEGATIVE
Influenza B by PCR: NEGATIVE
SARS Coronavirus 2 by RT PCR: NEGATIVE

## 2019-04-14 LAB — RUPTURE OF MEMBRANE (ROM)PLUS: Rom Plus: NEGATIVE

## 2019-04-14 LAB — ABO/RH: ABO/RH(D): O POS

## 2019-04-14 MED ORDER — PRENATAL MULTIVITAMIN CH
1.0000 | ORAL_TABLET | Freq: Every day | ORAL | Status: DC
  Administered 2019-04-15: 1 via ORAL
  Filled 2019-04-14: qty 1

## 2019-04-14 MED ORDER — FERROUS SULFATE 325 (65 FE) MG PO TABS
325.0000 mg | ORAL_TABLET | Freq: Every day | ORAL | Status: DC
  Administered 2019-04-15: 11:00:00 325 mg via ORAL
  Filled 2019-04-14: qty 1

## 2019-04-14 MED ORDER — ACETAMINOPHEN 325 MG PO TABS: 650.0000 mg | ORAL_TABLET | ORAL | Status: DC | PRN

## 2019-04-14 MED ORDER — DIPHENHYDRAMINE HCL 25 MG PO CAPS: 25.0000 mg | ORAL_CAPSULE | Freq: Four times a day (QID) | ORAL | Status: DC | PRN

## 2019-04-14 MED ORDER — ONDANSETRON HCL 4 MG/2ML IJ SOLN: 4.0000 mg | INTRAMUSCULAR | Status: DC | PRN

## 2019-04-14 MED ORDER — DIBUCAINE (PERIANAL) 1 % EX OINT: 1.0000 "application " | TOPICAL_OINTMENT | CUTANEOUS | Status: DC | PRN

## 2019-04-14 MED ORDER — BUTORPHANOL TARTRATE 1 MG/ML IJ SOLN: 1.0000 mg | INTRAMUSCULAR | Status: DC | PRN

## 2019-04-14 MED ORDER — WITCH HAZEL-GLYCERIN EX PADS: 1.0000 "application " | MEDICATED_PAD | CUTANEOUS | Status: DC | PRN

## 2019-04-14 MED ORDER — DIPHENHYDRAMINE HCL 50 MG/ML IJ SOLN: 12.5000 mg | INTRAMUSCULAR | Status: DC | PRN

## 2019-04-14 MED ORDER — SENNOSIDES-DOCUSATE SODIUM 8.6-50 MG PO TABS: 2.0000 | ORAL_TABLET | ORAL | Status: DC

## 2019-04-14 MED ORDER — IBUPROFEN 800 MG PO TABS
ORAL_TABLET | ORAL | Status: AC
  Filled 2019-04-14: qty 1

## 2019-04-14 MED ORDER — LIDOCAINE-EPINEPHRINE (PF) 1.5 %-1:200000 IJ SOLN
INTRAMUSCULAR | Status: DC | PRN
  Administered 2019-04-14: 3 mL via EPIDURAL

## 2019-04-14 MED ORDER — ONDANSETRON HCL 4 MG/2ML IJ SOLN: 4.0000 mg | Freq: Four times a day (QID) | INTRAMUSCULAR | Status: DC | PRN

## 2019-04-14 MED ORDER — METHYLERGONOVINE MALEATE 0.2 MG/ML IJ SOLN: 0.2000 mg | INTRAMUSCULAR | Status: DC | PRN

## 2019-04-14 MED ORDER — SOD CITRATE-CITRIC ACID 500-334 MG/5ML PO SOLN: 30.0000 mL | ORAL | Status: DC | PRN

## 2019-04-14 MED ORDER — PHENYLEPHRINE 40 MCG/ML (10ML) SYRINGE FOR IV PUSH (FOR BLOOD PRESSURE SUPPORT)
80.0000 ug | PREFILLED_SYRINGE | INTRAVENOUS | Status: DC | PRN
  Filled 2019-04-14: qty 10

## 2019-04-14 MED ORDER — IBUPROFEN 600 MG PO TABS
600.0000 mg | ORAL_TABLET | Freq: Four times a day (QID) | ORAL | Status: DC
  Administered 2019-04-14 – 2019-04-15 (×4): 600 mg via ORAL
  Filled 2019-04-14 (×4): qty 1

## 2019-04-14 MED ORDER — BENZOCAINE-MENTHOL 20-0.5 % EX AERO
1.0000 "application " | INHALATION_SPRAY | CUTANEOUS | Status: DC | PRN
  Administered 2019-04-15: 1 via TOPICAL
  Filled 2019-04-14: qty 56

## 2019-04-14 MED ORDER — LIDOCAINE HCL (PF) 1 % IJ SOLN
INTRAMUSCULAR | Status: AC
  Filled 2019-04-14: qty 30

## 2019-04-14 MED ORDER — FENTANYL 2.5 MCG/ML W/ROPIVACAINE 0.15% IN NS 100 ML EPIDURAL (ARMC)
EPIDURAL | Status: AC
  Filled 2019-04-14: qty 100

## 2019-04-14 MED ORDER — IBUPROFEN 600 MG PO TABS
ORAL_TABLET | ORAL | Status: AC
  Administered 2019-04-15: 600 mg via ORAL
  Filled 2019-04-14: qty 1

## 2019-04-14 MED ORDER — EPHEDRINE 5 MG/ML INJ
10.0000 mg | INTRAVENOUS | Status: DC | PRN
  Filled 2019-04-14: qty 2

## 2019-04-14 MED ORDER — LACTATED RINGERS IV SOLN
500.0000 mL | Freq: Once | INTRAVENOUS | Status: AC
  Administered 2019-04-14: 14:00:00 500 mL via INTRAVENOUS

## 2019-04-14 MED ORDER — OXYCODONE-ACETAMINOPHEN 5-325 MG PO TABS: 1.0000 | ORAL_TABLET | ORAL | Status: DC | PRN

## 2019-04-14 MED ORDER — ONDANSETRON HCL 4 MG PO TABS: 4.0000 mg | ORAL_TABLET | ORAL | Status: DC | PRN

## 2019-04-14 MED ORDER — OXYTOCIN 40 UNITS IN NORMAL SALINE INFUSION - SIMPLE MED
2.5000 [IU]/h | INTRAVENOUS | Status: DC
  Administered 2019-04-14: 18:00:00 2.5 [IU]/h via INTRAVENOUS
  Filled 2019-04-14: qty 1000

## 2019-04-14 MED ORDER — ACETAMINOPHEN 325 MG PO TABS
650.0000 mg | ORAL_TABLET | ORAL | Status: DC | PRN
  Administered 2019-04-14: 650 mg via ORAL
  Filled 2019-04-14: qty 2

## 2019-04-14 MED ORDER — OXYTOCIN BOLUS FROM INFUSION
500.0000 mL | Freq: Once | INTRAVENOUS | Status: AC
  Administered 2019-04-14: 500 mL via INTRAVENOUS

## 2019-04-14 MED ORDER — AMMONIA AROMATIC IN INHA
RESPIRATORY_TRACT | Status: AC
  Filled 2019-04-14: qty 10

## 2019-04-14 MED ORDER — SIMETHICONE 80 MG PO CHEW: 80.0000 mg | CHEWABLE_TABLET | ORAL | Status: DC | PRN

## 2019-04-14 MED ORDER — COCONUT OIL OIL: 1.0000 "application " | TOPICAL_OIL | Status: DC | PRN

## 2019-04-14 MED ORDER — OXYCODONE-ACETAMINOPHEN 5-325 MG PO TABS: 2.0000 | ORAL_TABLET | ORAL | Status: DC | PRN

## 2019-04-14 MED ORDER — LACTATED RINGERS IV SOLN: INTRAVENOUS | Status: DC

## 2019-04-14 MED ORDER — LACTATED RINGERS IV SOLN: 500.0000 mL | INTRAVENOUS | Status: DC | PRN

## 2019-04-14 MED ORDER — DOCUSATE SODIUM 100 MG PO CAPS
100.0000 mg | ORAL_CAPSULE | Freq: Two times a day (BID) | ORAL | Status: DC
  Administered 2019-04-15 (×2): 100 mg via ORAL
  Filled 2019-04-14 (×3): qty 1

## 2019-04-14 MED ORDER — MISOPROSTOL 200 MCG PO TABS
ORAL_TABLET | ORAL | Status: AC
  Filled 2019-04-14: qty 4

## 2019-04-14 MED ORDER — LIDOCAINE HCL (PF) 1 % IJ SOLN
30.0000 mL | INTRAMUSCULAR | Status: AC | PRN
  Administered 2019-04-14: 3 mL via SUBCUTANEOUS

## 2019-04-14 MED ORDER — TERBUTALINE SULFATE 1 MG/ML IJ SOLN: 0.2500 mg | Freq: Once | INTRAMUSCULAR | Status: DC | PRN

## 2019-04-14 MED ORDER — BUPIVACAINE HCL (PF) 0.25 % IJ SOLN
INTRAMUSCULAR | Status: DC | PRN
  Administered 2019-04-14 (×2): 5 mL via EPIDURAL

## 2019-04-14 MED ORDER — OXYTOCIN 40 UNITS IN NORMAL SALINE INFUSION - SIMPLE MED
1.0000 m[IU]/min | INTRAVENOUS | Status: DC
  Administered 2019-04-14: 4 m[IU]/min via INTRAVENOUS

## 2019-04-14 MED ORDER — FENTANYL 2.5 MCG/ML W/ROPIVACAINE 0.15% IN NS 100 ML EPIDURAL (ARMC)
12.0000 mL/h | EPIDURAL | Status: DC
  Administered 2019-04-14: 12 mL/h via EPIDURAL

## 2019-04-14 MED ORDER — OXYTOCIN 10 UNIT/ML IJ SOLN
INTRAMUSCULAR | Status: AC
  Filled 2019-04-14: qty 2

## 2019-04-14 MED ORDER — METHYLERGONOVINE MALEATE 0.2 MG PO TABS: 0.2000 mg | ORAL_TABLET | ORAL | Status: DC | PRN

## 2019-04-14 NOTE — OB Triage Note (Addendum)
    L&D OB Triage Note  SUBJECTIVE Tracy Stevens is a 24 y.o. G1P0 female at [redacted]w[redacted]d, EDD Estimated Date of Delivery: 04/18/19 who presented to triage with complaints of contractions. She feels good movement and denies leaking of fluid.    OB History  Gravida Para Term Preterm AB Living  1 0 0 0 0 0  SAB TAB Ectopic Multiple Live Births  0 0 0 0 0    # Outcome Date GA Lbr Len/2nd Weight Sex Delivery Anes PTL Lv  1 Current             No medications prior to admission.     OBJECTIVE  Nursing Evaluation:   BP 136/86 (BP Location: Left Arm)   Pulse 88   Temp 98.1 F (36.7 C) (Oral)   Ht 5\' 3"  (1.6 m)   Wt 93.9 kg   LMP 07/12/2018   BMI 36.67 kg/m    Findings:        False labor      NST was performed and has been reviewed by me.  NST INTERPRETATION: Category I  Mode: External Baseline Rate (A): 135 bpm Variability: Minimal, Moderate Accelerations: 15 x 15 Decelerations: None     Contraction Frequency (min): 3-4  ASSESSMENT Impression:  1.  Pregnancy:  G1P0 at [redacted]w[redacted]d , EDD Estimated Date of Delivery: 04/18/19 2.  Reassuring fetal and maternal status 3.  No cervical change  PLAN 1. Discussed current condition and above findings with patient and reassurance given.  All questions answered. 2. Discharge home with standard labor precautions given to return to L&D or call the office for problems. 3. Continue routine prenatal care.   RN did not call to notify me of pt discharge and that there was no cervical change. Woke up at 0430 and called L&D unit was notified that 06/18/19 RN   had discharged the pt per our plan of care that was discussed when I was notified of her arrival.  Reviewed strip @ 0430 and found to be reactive NST.   Jones Bales, CNM

## 2019-04-14 NOTE — OB Triage Note (Signed)
Pt. Presents with c/o of CTXs since 2000.

## 2019-04-14 NOTE — Telephone Encounter (Signed)
Pt called in and stated that her water had broke and she has 3 min contractions. I called back and told ss she advise the pt to go to the ED.

## 2019-04-14 NOTE — Anesthesia Preprocedure Evaluation (Signed)
Anesthesia Evaluation  Patient identified by MRN, date of birth, ID band Patient awake    Reviewed: Allergy & Precautions, H&P , NPO status , Patient's Chart, lab work & pertinent test results  Airway Mallampati: III  TM Distance: >3 FB Neck ROM: full    Dental  (+) Dental Advidsory Given, Chipped   Pulmonary           Cardiovascular + dysrhythmias Supra Ventricular Tachycardia      Neuro/Psych  Headaches, Anxiety  Neuromuscular disease (h/o low back pain)    GI/Hepatic Neg liver ROS, GERD  ,  Endo/Other    Renal/GU Renal disease bladder dysfunction      Musculoskeletal   Abdominal   Peds  Hematology negative hematology ROS (+)   Anesthesia Other Findings   Reproductive/Obstetrics (+) Pregnancy                             Anesthesia Physical Anesthesia Plan  ASA: II  Anesthesia Plan: Epidural   Post-op Pain Management:    Induction:   PONV Risk Score and Plan:   Airway Management Planned:   Additional Equipment:   Intra-op Plan:   Post-operative Plan:   Informed Consent: I have reviewed the patients History and Physical, chart, labs and discussed the procedure including the risks, benefits and alternatives for the proposed anesthesia with the patient or authorized representative who has indicated his/her understanding and acceptance.       Plan Discussed with: CRNA  Anesthesia Plan Comments:         Anesthesia Quick Evaluation

## 2019-04-14 NOTE — Anesthesia Procedure Notes (Signed)
Epidural ?Patient location during procedure: OB ? ?Staffing ?Anesthesiologist: Zak, Arthur, MD ?Resident/CRNA: Johntavious Francom, CRNA ?Performed: resident/CRNA  ? ?Preanesthetic Checklist ?Completed: patient identified, IV checked, site marked, risks and benefits discussed, surgical consent, monitors and equipment checked, pre-op evaluation and timeout performed ? ?Epidural ?Patient position: sitting ?Prep: ChloraPrep and site prepped and draped ?Patient monitoring: heart rate, continuous pulse ox and blood pressure ?Approach: midline ?Location: L4-L5 ?Injection technique: LOR saline ? ?Needle:  ?Needle type: Tuohy  ?Needle gauge: 17 G ?Needle length: 9 cm and 9 ?Needle insertion depth: 8 cm ?Catheter type: closed end flexible ?Catheter size: 19 Gauge ?Catheter at skin depth: 13 cm ?Test dose: negative and 1.5% lidocaine with Epi 1:200 K ? ?Assessment ?Events: blood not aspirated, injection not painful, no injection resistance, no paresthesia and negative IV test ? ?Additional Notes ? ? ?Patient tolerated the insertion well without complications.Reason for block:procedure for pain ? ? ? ?

## 2019-04-14 NOTE — H&P (Signed)
History and Physical   HPI  Tracy Stevens is a 24 y.o. G1P0 at [redacted]w[redacted]d Estimated Date of Delivery: 04/18/19 who is being admitted for labor management.   OB History  OB History  Gravida Para Term Preterm AB Living  1 0 0 0 0 0  SAB TAB Ectopic Multiple Live Births  0 0 0 0 0    # Outcome Date GA Lbr Len/2nd Weight Sex Delivery Anes PTL Lv  1 Current             PROBLEM LIST  Pregnancy complications or risks: Patient Active Problem List   Diagnosis Date Noted  . Pregnancy 04/14/2019  . Labor and delivery, indication for care 04/06/2019  . Pelvic pain affecting pregnancy in second trimester, antepartum 10/30/2018  . Urge incontinence of urine 10/30/2018  . Maternal varicella, non-immune 10/02/2018  . Low back pain 09/30/2018  . BMI 35.0-35.9,adult 09/30/2018  . Family history of breast cancer 09/30/2018  . Intractable migraine with aura with status migrainosus 11/08/2015    Prenatal labs and studies: ABO, Rh: O/Positive/-- (09/15 1012) Antibody: Negative (09/15 1012) Rubella: 1.34 (09/15 1012) RPR: Non Reactive (01/08 0932)  HBsAg: Negative (09/15 1012)  HIV: Non Reactive (09/15 1012)  WUJ:WJXBJYNW/-- (03/10 1120)   Past Medical History:  Diagnosis Date  . Congenital enlarged kidney   . Headache      Past Surgical History:  Procedure Laterality Date  . NASAL SINUS SURGERY       Medications    Current Discharge Medication List    CONTINUE these medications which have NOT CHANGED   Details  acetaminophen (TYLENOL) 325 MG tablet Take 650 mg by mouth every 6 (six) hours as needed.    Prenatal Vit-Fe Fumarate-FA (PRENATAL MULTIVITAMIN) TABS tablet Take 1 tablet by mouth daily at 12 noon.         Allergies  Patient has no known allergies.  Review of Systems  Constitutional: negative Eyes: negative Ears, nose, mouth, throat, and face: negative Respiratory: negative Cardiovascular: negative Gastrointestinal:  negative Genitourinary:negative Integument/breast: negative Hematologic/lymphatic: negative Musculoskeletal:negative Neurological: negative Behavioral/Psych: negative Endocrine: negative Allergic/Immunologic: negative  Physical Exam  BP (!) 142/105 (BP Location: Left Arm)   Pulse 93   Temp 98.2 F (36.8 C) (Oral)   Resp 18   Ht 5\' 3"  (1.6 m)   Wt 93.9 kg   LMP 07/12/2018   BMI 36.67 kg/m   Lungs:  CTA B Cardio: RRR  Abd: Soft, gravid, NT Presentation: cephalic EXT: No C/C/ 1+ Edema DTRs: 2+ B CERVIX: Dilation: 6.5 Effacement (%): 90 Cervical Position: Middle Station: -1 Presentation: Vertex Exam by:: A Grandville Silos CNM  See Prenatal records for more detailed PE.     FHR:  Baseline: 135 bpm, Variability: Good {> 6 bpm), Accelerations: Non-reactive but appropriate for gestational age and Decelerations: Early  Toco: Uterine Contractions: Frequency: Every 2-4 minutes, Duration: 50-80 seconds and Intensity: moderate  Test Results  Results for orders placed or performed during the hospital encounter of 04/14/19 (from the past 24 hour(s))  ROM Plus ([facility] only)     Status: None   Collection Time: 04/14/19 11:36 AM  Result Value Ref Range   Rom Plus NEGATIVE    Group B Strep negative  Assessment   G1P0 at [redacted]w[redacted]d Estimated Date of Delivery: 04/18/19  The fetus is reassuring.   Patient Active Problem List   Diagnosis Date Noted  . Pregnancy 04/14/2019  . Labor and delivery, indication for care 04/06/2019  .  Pelvic pain affecting pregnancy in second trimester, antepartum 10/30/2018  . Urge incontinence of urine 10/30/2018  . Maternal varicella, non-immune 10/02/2018  . Low back pain 09/30/2018  . BMI 35.0-35.9,adult 09/30/2018  . Family history of breast cancer 09/30/2018  . Intractable migraine with aura with status migrainosus 11/08/2015    Plan  1. Admit to L&D :   2. EFM:-- Category 1 3. Stadol or Epidural if desired.   4. Admission labs  5. Anticipate  NSVD  Doreene Burke, CNM  04/14/2019 1:10 PM

## 2019-04-14 NOTE — OB Triage Note (Signed)
Pt. presented to L/D triage from home with reported SROM at 10:30 when she stood. She reports that the color was brown/red with a moderate gush of fluid. She has also been contracting since 2200 last night. Pain is intermittent, rated 6/10, with a noted increase in intensity since 10:30. Positive fetal movement. VSS. Monitors applied and assessing.

## 2019-04-15 LAB — CBC
HCT: 31.6 % — ABNORMAL LOW (ref 36.0–46.0)
Hemoglobin: 10.3 g/dL — ABNORMAL LOW (ref 12.0–15.0)
MCH: 28.5 pg (ref 26.0–34.0)
MCHC: 32.6 g/dL (ref 30.0–36.0)
MCV: 87.5 fL (ref 80.0–100.0)
Platelets: 211 10*3/uL (ref 150–400)
RBC: 3.61 MIL/uL — ABNORMAL LOW (ref 3.87–5.11)
RDW: 13.8 % (ref 11.5–15.5)
WBC: 14.2 10*3/uL — ABNORMAL HIGH (ref 4.0–10.5)
nRBC: 0 % (ref 0.0–0.2)

## 2019-04-15 LAB — RPR: RPR Ser Ql: NONREACTIVE

## 2019-04-15 MED ORDER — FERROUS SULFATE 325 (65 FE) MG PO TABS: 325.0000 mg | ORAL_TABLET | Freq: Every day | ORAL | 3 refills | Status: DC

## 2019-04-15 MED ORDER — IBUPROFEN 600 MG PO TABS: 600.0000 mg | ORAL_TABLET | Freq: Four times a day (QID) | ORAL | 0 refills | Status: DC

## 2019-04-15 MED ORDER — DOCUSATE SODIUM 100 MG PO CAPS: 100.0000 mg | ORAL_CAPSULE | Freq: Two times a day (BID) | ORAL | 0 refills | Status: DC

## 2019-04-15 MED ORDER — VARICELLA VIRUS VACCINE LIVE 1350 PFU/0.5ML IJ SUSR
0.5000 mL | Freq: Once | INTRAMUSCULAR | Status: AC
  Administered 2019-04-15: 0.5 mL via SUBCUTANEOUS
  Filled 2019-04-15 (×2): qty 0.5

## 2019-04-15 NOTE — Anesthesia Post-op Follow-up Note (Signed)
  Anesthesia Pain Follow-up Note  Patient: Tracy Stevens  Day #: 1  Date of Follow-up: 04/15/2019 Time: 7:19 AM  Last Vitals:  Vitals:   04/15/19 0022 04/15/19 0340  BP: 124/79 122/79  Pulse: 74 81  Resp: 16 16  Temp: 36.6 C 36.6 C  SpO2: 100% 98%    Level of Consciousness: alert  Pain: mild   Side Effects:None  Catheter Site Exam:clean, dry  Epidural / Intrathecal (From admission, onward)   Start     Dose/Rate Route Frequency Ordered Stop   04/14/19 1400  fentaNYL 2.5 mcg/ml w/ropivacaine 0.15% (PF) in normal saline 100 mL EPIDURAL infusion     12 mL/hr 12 mL/hr  Epidural Continuous 04/14/19 1349         Plan: D/C from anesthesia care at surgeon's request  [name]

## 2019-04-15 NOTE — Lactation Note (Signed)
This note was copied from a baby's chart. Lactation Consultation Note  Patient Name: Tracy Stevens [id number]'Tracy Date: 04/15/2019    [employer] student, Denzil Magnuson, entered room for formula education conversation.   Upon entering, mother of baby and father of baby both present. Infant sleeping on mother of baby.   [facility] student educated parents on safe infant formula preparation, according to the guidelines on the handout provided per hospital's policy. [facility] student also went over infant hunger cues, feeding on demand and advised on boiling water for the first the months, per handout.   Mother of baby did have questions about slow-flow vs. Fast-flow nipples. [facility] student advised to stick to the slow-flow for proper paced bottle feeding.   Mother of baby instructed to follow guidelines on handout as well as on the formula bottles/cans when they return home. Outpatient lactation information given for if parent has any questions about breast/breastfeeding that may arise.    Maternal Data    Feeding    LATCH Score                   Interventions    Lactation Tools Discussed/Used     Consult Status      Tracy Stevens Tracy Stevens 04/15/2019, 9:23 AM

## 2019-04-15 NOTE — Discharge Summary (Signed)
  Obstetric Discharge Summary  Patient ID: Tracy Stevens MRN: [id number] DOB/AGE: [birth year] 24 y.o.   Date of Admission: 04/14/2019  Date of Discharge:  04/15/19  Admitting Diagnosis: Onset of Labor at [redacted]w[redacted]d  Secondary Diagnosis: Varicella non-immune  Mode of Delivery: normal spontaneous vaginal delivery     Discharge Diagnosis: No other diagnosis   Intrapartum Procedures: epidural and pitocin augmentation   Post partum procedures: varicella vaccine  Complications: none   Brief Hospital Course   Tracy Stevens is a G1P1001 who had a SVD on 04/14/2019;  for further details of this birth, please refer to the delivey summary.  Patient had an uncomplicated postpartum course.  By time of discharge on PPD#1, her pain was controlled on oral pain medications; she had appropriate lochia and was ambulating, voiding without difficulty and tolerating regular diet.  She was deemed stable for discharge to home.    Labs:  CBC Latest Ref Rng & Units 04/15/2019 04/14/2019 04/06/2019  WBC 4.0 - 10.5 K/uL 14.2(H) 17.9(H) 9.6  Hemoglobin 12.0 - 15.0 g/dL 10.3(L) 12.6 11.0(L)  Hematocrit 36.0 - 46.0 % 31.6(L) 38.5 34.1(L)  Platelets 150 - 400 K/uL 211 273 257   O POS Performed at [facility], [address] [place], [place] [address]   Physical exam:   Temp:  [97.7 F (36.5 C)-98.5 F (36.9 C)] 97.7 F (36.5 C) (03/31 0830) Pulse Rate:  [72-104] 72 (03/31 0830) Resp:  [14-16] 16 (03/31 0340) BP: (97-137)/(59-95) 130/89 (03/31 0830) SpO2:  [96 %-100 %] 98 % (03/31 0830)  General: alert and no distress  Lochia: appropriate  Abdomen: soft, NT  Uterine Fundus: firm  Perineum: no significant drainage, no dehiscence, no significant erythema  Extremities: No evidence of DVT seen on physical exam. No lower extremity edema.  EDPS not completed.   Discharge Instructions: Per After Visit Summary.  Activity: Advance as tolerated. Pelvic rest for 6 weeks.  Also refer to  After Visit Summary  Diet: Regular  Medications: Allergies as of 04/15/2019   No Known Allergies     Medication List    TAKE these medications   acetaminophen 325 MG tablet Commonly known as: TYLENOL Take 650 mg by mouth every 6 (six) hours as needed.   docusate sodium 100 MG capsule Commonly known as: COLACE Take 1 capsule (100 mg total) by mouth 2 (two) times daily.   ferrous sulfate 325 (65 FE) MG tablet Take 1 tablet (325 mg total) by mouth daily with breakfast. Start taking on: April 16, 2019   ibuprofen 600 MG tablet Commonly known as: ADVIL Take 1 tablet (600 mg total) by mouth every 6 (six) hours.   prenatal multivitamin Tabs tablet Take 1 tablet by mouth daily at 12 noon.      Outpatient follow up:  Follow-up Information    Doreene Burke, CNM Follow up.   Specialties: Certified Nurse Midwife, Radiology Why: Someone from the office will call to schedule your two (2) week postpartum televisit and six (6) week postpartum visit with [facility] information: [address] [place] [address] [phone]          Postpartum contraception: condoms  Discharged Condition: stable  Discharged to: home   Newborn Data:  Disposition:home with mother  Apgars: APGAR (1 MIN): 9   APGAR (5 MINS): 9    Baby Feeding: Formula   Gunnar Bulla, CNM [facility], [facility] 04/15/19 12:31 PM

## 2019-04-15 NOTE — Anesthesia Postprocedure Evaluation (Signed)
Anesthesia Post Note  Patient: Tracy Stevens  Procedure(s) Performed: AN AD HOC LABOR EPIDURAL  Patient location during evaluation: Mother Baby Anesthesia Type: Epidural Level of consciousness: awake and alert Pain management: pain level controlled Vital Signs Assessment: post-procedure vital signs reviewed and stable Respiratory status: spontaneous breathing, nonlabored ventilation and respiratory function stable Cardiovascular status: stable Postop Assessment: no headache, no backache and epidural receding Anesthetic complications: no     Last Vitals:  Vitals:   04/15/19 0022 04/15/19 0340  BP: 124/79 122/79  Pulse: 74 81  Resp: 16 16  Temp: 36.6 C 36.6 C  SpO2: 100% 98%    Last Pain:  Vitals:   04/15/19 0340  TempSrc: Oral  PainSc:                  [name]

## 2019-04-15 NOTE — Discharge Instructions (Signed)
Paced Infant Bottle Feeding Paced bottle feeding is a way to bottle feed your baby that is more like breastfeeding. This type of feeding helps your baby learn to eat more slowly and stop when full. This can help prevent overfeeding and discomfort. Paced bottle feeding may also help your baby feel comfortable with both bottle feeding and breastfeeding. The goal is to provide a bottle feeding that is similar to the pace and flow of breast milk from the breast. This is done by holding the bottle in a way that controls the flow of milk, and by taking periodic breaks during feedings. Paced bottle feeding works well if you want to continue breastfeeding but will sometimes need to bottle feed your baby with pumped breast milk or formula. You may also want to consider this method if:  You are unable to breastfeed.  You want others to feed your baby, such as if you are returning to work. How to plan for paced bottle feeding Before you start bottle feeding, talk to your child's health care provider or a Advertising copywriter. Ask what type of formula and bottle would work best for your baby. In general, a 7-ounce bottle with a slow flow nipple works well. If you are going to pump breast milk, ask how often you should pump, and learn how to pump and store your milk safely. Plan to bottle feed on demand, which means feeding whenever your baby shows signs of being hungry. Your baby may:  Put fingers or a hand into his or her mouth.  Clench fists over the tummy or flex arms and legs.  Turn the head and open the mouth as if looking for a nipple (rooting).  Make sucking noises.  Cry. This is usually a late sign of hunger.  Act fussy or restless. How to prepare the bottle To get the bottle ready for bottle feeding:  Wash your hands.  Make sure the bottle and nipple are clean. ? If you are using the bottle for the first time, sterilize all parts in boiling water for 10 minutes; cool and  air-dry. ? After the first use, you can clean all the bottle parts in hot, soapy water and rinse thoroughly once a day.  If you are using formula, follow the directions for mixing the formula or the instructions from your child's health care provider.  If you are using breast milk, thaw the milk in the refrigerator. Do not use breast milk after it has been thawed or stored in the refrigerator for longer than 24 hours.  You may heat the bottle in warm water. Make sure it is not too hot or too cold. Never use a microwave to warm up a bottle. How to perform paced bottle feeding Follow these steps for paced bottle feeding: 1. Hold your baby close to your body at a slight angle, or semi-upright, as you would for breastfeeding. Your baby's head should be higher than his or her stomach. 2. Support your baby's head in the crook of your arm. 3. Place the nipple on your baby's cheek. Let your baby root around to find the nipple. You may tickle or stroke your baby's lips with the nipple to stimulate rooting. Let your baby draw the bottle nipple into the mouth on his or her own, just like the baby does with breastfeeding. 4. Once your baby latches on to the nipple, hold the bottle flat, parallel to the floor. This will help your baby control the flow of milk so  that it does not come out too fast. 5. Tip the bottle slightly to let about half the nipple fill. Do not tilt the bottle straight up into the air. This will force too much milk or formula into your baby's mouth. 6. After about three to five sucks, tilt the bottle back to flat, wait a few seconds, and tilt back up slightly. Continue tilting and pausing. This allows your baby to pace the feeding. 7. About halfway through the feeding, switch arms so you are holding your baby on the other side. This is similar to switching breasts when breastfeeding. 8. Watch for signs that your baby is full. When your baby has had enough to eat, he or she may: ? Eat more  slowly or stop. ? Become distracted. ? Turn away and stop sucking. ? Become very relaxed or fall asleep. ? Have his or her hands open and relaxed. 9. When it is time to stop, gently remove the nipple from your baby's mouth. Offer the nipple again and let your baby feed for about three to five sucks. Remove the nipple again. Keep offering and removing until your baby refuses the nipple or no longer sucks. 10. Try to have a bottle feeding last about the same amount of time as a breastfeeding session. This is usually around 15-20 minutes. General tips  Watch for the following signs that your baby may be overfeeding or eating too quickly: ? Gulping. ? Drooling. ? Noisy feeding. ? Coughing or choking.  Start paced bottle feeding on demand. Over time, your baby will become hungry at predictable times.  Feed your baby in a quiet and comfortable place. Avoid distractions. Pay attention to pacing and signs of fullness.  Do not bottle feed your baby anything other than breast milk or formula until your baby's health care provider says that you can start other feedings.  Let your baby's health care provider know if your baby: ? Is fussy or seems uncomfortable after feeding. ? Vomits after feedings. ? Refuses to take the bottle or your breast. Where to find more information [place] Department of Health and Human Services: https://torres-moran.org/www.womenshealth.gov/breastfeeding Summary  Paced bottle feeding is a way to bottle feed your baby that is more like breastfeeding.  Paced bottle feeding helps your baby learn to eat only when hungry and to avoid overeating.  Ask your baby's health care provider to recommend types of bottles and formula. If you plan to use pumped breast milk, learn how to pump and store your milk.  Follow the steps for performing paced feeding and stop when your baby shows signs of being full. This information is not intended to replace advice given to you by your health care provider. Make  sure you discuss any questions you have with your health care provider. Document Revised: 10/18/2016 Document Reviewed: 10/18/2016 Elsevier Patient Education  2020 Elsevier Inc[employer]   Postpartum Care After Vaginal Delivery This sheet gives you information about how to care for yourself from the time you deliver your baby to up to 6-12 weeks after delivery (postpartum period). Your health care provider may also give you more specific instructions. If you have problems or questions, contact your health care provider. Follow these instructions at home: Vaginal bleeding  It is normal to have vaginal bleeding (lochia) after delivery. Wear a sanitary pad for vaginal bleeding and discharge. ? During the first week after delivery, the amount and appearance of lochia is often similar to a menstrual period. ? Over the next few weeks,  it will gradually decrease to a dry, yellow-brown discharge. ? For most women, lochia stops completely by 4-6 weeks after delivery. Vaginal bleeding can vary from woman to woman.  Change your sanitary pads frequently. Watch for any changes in your flow, such as: ? A sudden increase in volume. ? A change in color. ? Large blood clots.  If you pass a blood clot from your vagina, save it and call your health care provider to discuss. Do not flush blood clots down the toilet before talking with your health care provider.  Do not use tampons or douches until your health care provider says this is safe.  If you are not breastfeeding, your period should return 6-8 weeks after delivery. If you are feeding your child breast milk only (exclusive breastfeeding), your period may not return until you stop breastfeeding. Perineal care  Keep the area between the vagina and the anus (perineum) clean and dry as told by your health care provider. Use medicated pads and pain-relieving sprays and creams as directed.  If you had a cut in the perineum (episiotomy) or a tear in the vagina,  check the area for signs of infection until you are healed. Check for: ? More redness, swelling, or pain. ? Fluid or blood coming from the cut or tear. ? Warmth. ? Pus or a bad smell.  You may be given a squirt bottle to use instead of wiping to clean the perineum area after you go to the bathroom. As you start healing, you may use the squirt bottle before wiping yourself. Make sure to wipe gently.  To relieve pain caused by an episiotomy, a tear in the vagina, or swollen veins in the anus (hemorrhoids), try taking a warm sitz bath 2-3 times a day. A sitz bath is a warm water bath that is taken while you are sitting down. The water should only come up to your hips and should cover your buttocks. Breast care  Within the first few days after delivery, your breasts may feel heavy, full, and uncomfortable (breast engorgement). Milk may also leak from your breasts. Your health care provider can suggest ways to help relieve the discomfort. Breast engorgement should go away within a few days.  If you are breastfeeding: ? Wear a bra that supports your breasts and fits you well. ? Keep your nipples clean and dry. Apply creams and ointments as told by your health care provider. ? You may need to use breast pads to absorb milk that leaks from your breasts. ? You may have uterine contractions every time you breastfeed for up to several weeks after delivery. Uterine contractions help your uterus return to its normal size. ? If you have any problems with breastfeeding, work with your health care provider or Advertising copywriter.  If you are not breastfeeding: ? Avoid touching your breasts a lot. Doing this can make your breasts produce more milk. ? Wear a good-fitting bra and use cold packs to help with swelling. ? Do not squeeze out (express) milk. This causes you to make more milk. Intimacy and sexuality  Ask your health care provider when you can engage in sexual activity. This may depend on: ? Your  risk of infection. ? How fast you are healing. ? Your comfort and desire to engage in sexual activity.  You are able to get pregnant after delivery, even if you have not had your period. If desired, talk with your health care provider about methods of birth control (contraception). Medicines  Take over-the-counter and prescription medicines only as told by your health care provider.  If you were prescribed an antibiotic medicine, take it as told by your health care provider. Do not stop taking the antibiotic even if you start to feel better. Activity  Gradually return to your normal activities as told by your health care provider. Ask your health care provider what activities are safe for you.  Rest as much as possible. Try to rest or take a nap while your baby is sleeping. Eating and drinking   Drink enough fluid to keep your urine pale yellow.  Eat high-fiber foods every day. These may help prevent or relieve constipation. High-fiber foods include: ? Whole grain cereals and breads. ? Brown rice. ? Beans. ? Fresh fruits and vegetables.  Do not try to lose weight quickly by cutting back on calories.  Take your prenatal vitamins until your postpartum checkup or until your health care provider tells you it is okay to stop. Lifestyle  Do not use any products that contain nicotine or tobacco, such as cigarettes and e-cigarettes. If you need help quitting, ask your health care provider.  Do not drink alcohol, especially if you are breastfeeding. General instructions  Keep all follow-up visits for you and your baby as told by your health care provider. Most women visit their health care provider for a postpartum checkup within the first 3-6 weeks after delivery. Contact a health care provider if:  You feel unable to cope with the changes that your child brings to your life, and these feelings do not go away.  You feel unusually sad or worried.  Your breasts become red, painful,  or hard.  You have a fever.  You have trouble holding urine or keeping urine from leaking.  You have little or no interest in activities you used to enjoy.  You have not breastfed at all and you have not had a menstrual period for 12 weeks after delivery.  You have stopped breastfeeding and you have not had a menstrual period for 12 weeks after you stopped breastfeeding.  You have questions about caring for yourself or your baby.  You pass a blood clot from your vagina. Get help right away if:  You have chest pain.  You have difficulty breathing.  You have sudden, severe leg pain.  You have severe pain or cramping in your lower abdomen.  You bleed from your vagina so much that you fill more than one sanitary pad in one hour. Bleeding should not be heavier than your heaviest period.  You develop a severe headache.  You faint.  You have blurred vision or spots in your vision.  You have bad-smelling vaginal discharge.  You have thoughts about hurting yourself or your baby. If you ever feel like you may hurt yourself or others, or have thoughts about taking your own life, get help right away. You can go to the nearest emergency department or call:  Your local emergency services (911 in the [place]  A suicide crisis helpline, such as the [place] at (909) 242-3533. This is open 24 hours a day. Summary  The period of time right after you deliver your newborn up to 6-12 weeks after delivery is called the postpartum period.  Gradually return to your normal activities as told by your health care provider.  Keep all follow-up visits for you and your baby as told by your health care provider. This information is not intended to replace advice given to you  by your health care provider. Make sure you discuss any questions you have with your health care provider. Document Revised: 01/04/2017 Document Reviewed: 10/15/2016 Elsevier Patient Education  2020  [employer].   Postpartum Baby Blues The postpartum period begins right after the birth of a baby. During this time, there is often a lot of joy and excitement. It is also a time of many changes in the life of the parents. No matter how many times a mother gives birth, each child brings new challenges to the family, including different ways of relating to one another. It is common to have feelings of excitement along with confusing changes in moods, emotions, and thoughts. You may feel happy one minute and sad or stressed the next. These feelings of sadness usually happen in the period right after you have your baby, and they go away within a week or two. This is called the "baby blues." What are the causes? There is no known cause of baby blues. It is likely caused by a combination of factors. However, changes in hormone levels after childbirth are believed to trigger some of the symptoms. Other factors that can play a role in these mood changes include:  Lack of sleep.  Stressful life events, such as poverty, caring for a loved one, or death of a loved one.  Genetics. What are the signs or symptoms? Symptoms of this condition include:  Brief changes in mood, such as going from extreme happiness to sadness.  Decreased concentration.  Difficulty sleeping.  Crying spells and tearfulness.  Loss of appetite.  Irritability.  Anxiety. If the symptoms of baby blues last for more than 2 weeks or become more severe, you may have postpartum depression. How is this diagnosed? This condition is diagnosed based on an evaluation of your symptoms. There are no medical or lab tests that lead to a diagnosis, but there are various questionnaires that a health care provider may use to identify women with the baby blues or postpartum depression. How is this treated? Treatment is not needed for this condition. The baby blues usually go away on their own in 1-2 weeks. Social support is often all that  is needed. You will be encouraged to get adequate sleep and rest. Follow these instructions at home: Lifestyle      Get as much rest as you can. Take a nap when the baby sleeps.  Exercise regularly as told by your health care provider. Some women find yoga and walking to be helpful.  Eat a balanced and nourishing diet. This includes plenty of fruits and vegetables, whole grains, and lean proteins.  Do little things that you enjoy. Have a cup of tea, take a bubble bath, read your favorite magazine, or listen to your favorite music.  Avoid alcohol.  Ask for help with household chores, cooking, grocery shopping, or running errands. Do not try to do everything yourself. Consider hiring a postpartum doula to help. This is a professional who specializes in providing support to new mothers.  Try not to make any major life changes during pregnancy or right after giving birth. This can add stress. General instructions  Talk to people close to you about how you are feeling. Get support from your partner, family members, friends, or other new moms. You may want to join a support group.  Find ways to cope with stress. This may include: ? Writing your thoughts and feelings in a journal. ? Spending time outside. ? Spending time with people who make  you laugh.  Try to stay positive in how you think. Think about the things you are grateful for.  Take over-the-counter and prescription medicines only as told by your health care provider.  Let your health care provider know if you have any concerns.  Keep all postpartum visits as told by your health care provider. This is important. Contact a health care provider if:  Your baby blues do not go away after 2 weeks. Get help right away if:  You have thoughts of taking your own life (suicidal thoughts).  You think you may harm the baby or other people.  You see or hear things that are not there (hallucinations). Summary  After giving birth,  you may feel happy one minute and sad or stressed the next. Feelings of sadness that happen right after the baby is born and go away after a week or two are called the "baby blues."  You can manage the baby blues by getting enough rest, eating a healthy diet, exercising, spending time with supportive people, and finding ways to cope with stress.  If feelings of sadness and stress last longer than 2 weeks or get in the way of caring for your baby, talk to your health care provider. This may mean you have postpartum depression. This information is not intended to replace advice given to you by your health care provider. Make sure you discuss any questions you have with your health care provider. Document Revised: 04/25/2018 Document Reviewed: 02/28/2016 Elsevier Patient Education  2020 [employer].

## 2019-04-15 NOTE — Progress Notes (Signed)
DC inst reviewed and pt verb u/o.  Reviewed s/s of when to call provider.  Verb u/o of f/u appointment.  DC to home to car via wc with NB.

## 2019-04-21 ENCOUNTER — Encounter: Payer: 59 | Admitting: Certified Nurse Midwife

## 2019-04-21 ENCOUNTER — Other Ambulatory Visit: Payer: 59

## 2019-04-28 ENCOUNTER — Encounter: Payer: Self-pay | Admitting: Certified Nurse Midwife

## 2019-04-28 ENCOUNTER — Ambulatory Visit (INDEPENDENT_AMBULATORY_CARE_PROVIDER_SITE_OTHER): Payer: 59 | Admitting: Certified Nurse Midwife

## 2019-04-28 ENCOUNTER — Other Ambulatory Visit: Payer: Self-pay

## 2019-04-28 ENCOUNTER — Telehealth: Payer: Self-pay

## 2019-04-28 DIAGNOSIS — Z1331 Encounter for screening for depression: Secondary | ICD-10-CM

## 2019-04-28 NOTE — Telephone Encounter (Signed)
Tracy Stevens attempted to contact patient for a televisit. No answer. Mychart message sent to patient.

## 2019-04-28 NOTE — Patient Instructions (Signed)

## 2019-04-28 NOTE — Progress Notes (Signed)
Virtual Visit via Telephone Note  I connected with Tracy Stevens on 04/28/19 at  9:30 AM EDT by telephone and verified that I am speaking with the correct person using two identifiers.   I discussed the limitations, risks, security and privacy concerns of performing an evaluation and management service by telephone and the availability of in person appointments. I also discussed with the patient that there may be a patient responsible charge related to this service. The patient expressed understanding and agreed to proceed.  Present on phone call Pt @ home Provider: Doreene Burke CNM @ office [facility] RN, @ office  History of Present Illness: 24 yr old G2P2002 2 wks postpartum     Observations/Objective: Doing well. Baby is formula feeding and has gained a pound. She did have thrush which was hard but is better now. She state she has some good days and some bad days with her mood. States that her bleeding in gradually decreasing and has has some after birth pains that she is managing with motrin.   Phq9:8 mild.  Assessment and Plan:  Discussed use of family support and PSI information sent in my chart message. Discussed medication use and counseling if needed. Pt encouraged to come in sooner if bad days out weigh good days. She agrees to plan. Encouraged use of her support system to help to allow her to rest and take time for herself. She verbalizes and agrees to plan. Follow up at 6 wk ppv or sooner if needed.   Doreene Burke, CNM     Follow Up Instructions:    I discussed the assessment and treatment plan with the patient. The patient was provided an opportunity to ask questions and all were answered. The patient agreed with the plan and demonstrated an understanding of the instructions.   The patient was advised to call back or seek an in-person evaluation if the symptoms worsen or if the condition fails to improve as anticipated.  I provided 8 minutes of non-face-to-face  time during this encounter.   Doreene Burke, CNM

## 2019-04-28 NOTE — Progress Notes (Signed)
Received transferred call from [place] for a televisit. Patient was identified by DOB. States she is bottle feeding. Flow varies but nothing concerning to her. PhQ9= 8. Call transferred to [facility] CNM

## 2019-05-26 ENCOUNTER — Ambulatory Visit (INDEPENDENT_AMBULATORY_CARE_PROVIDER_SITE_OTHER): Payer: 59 | Admitting: Certified Nurse Midwife

## 2019-05-26 ENCOUNTER — Encounter: Payer: Self-pay | Admitting: Certified Nurse Midwife

## 2019-05-26 ENCOUNTER — Other Ambulatory Visit: Payer: Self-pay

## 2019-05-26 NOTE — Patient Instructions (Signed)
Preventive Care 21-25 Years Old, Female Preventive care refers to visits with your health care provider and lifestyle choices that can promote health and wellness. This includes:  A yearly physical exam. This may also be called an annual well check.  Regular dental visits and eye exams.  Immunizations.  Screening for certain conditions.  Healthy lifestyle choices, such as eating a healthy diet, getting regular exercise, not using drugs or products that contain nicotine and tobacco, and limiting alcohol use. What can I expect for my preventive care visit? Physical exam Your health care provider will check your:  Height and weight. This may be used to calculate body mass index (BMI), which tells if you are at a healthy weight.  Heart rate and blood pressure.  Skin for abnormal spots. Counseling Your health care provider may ask you questions about your:  Alcohol, tobacco, and drug use.  Emotional well-being.  Home and relationship well-being.  Sexual activity.  Eating habits.  Work and work environment.  Method of birth control.  Menstrual cycle.  Pregnancy history. What immunizations do I need?  Influenza (flu) vaccine  This is recommended every year. Tetanus, diphtheria, and pertussis (Tdap) vaccine  You may need a Td booster every 10 years. Varicella (chickenpox) vaccine  You may need this if you have not been vaccinated. Human papillomavirus (HPV) vaccine  If recommended by your health care provider, you may need three doses over 6 months. Measles, mumps, and rubella (MMR) vaccine  You may need at least one dose of MMR. You may also need a second dose. Meningococcal conjugate (MenACWY) vaccine  One dose is recommended if you are age 19-21 years and a first-year college student living in a residence hall, or if you have one of several medical conditions. You may also need additional booster doses. Pneumococcal conjugate (PCV13) vaccine  You may need  this if you have certain conditions and were not previously vaccinated. Pneumococcal polysaccharide (PPSV23) vaccine  You may need one or two doses if you smoke cigarettes or if you have certain conditions. Hepatitis A vaccine  You may need this if you have certain conditions or if you travel or work in places where you may be exposed to hepatitis A. Hepatitis B vaccine  You may need this if you have certain conditions or if you travel or work in places where you may be exposed to hepatitis B. Haemophilus influenzae type b (Hib) vaccine  You may need this if you have certain conditions. You may receive vaccines as individual doses or as more than one vaccine together in one shot (combination vaccines). Talk with your health care provider about the risks and benefits of combination vaccines. What tests do I need?  Blood tests  Lipid and cholesterol levels. These may be checked every 5 years starting at age 20.  Hepatitis C test.  Hepatitis B test. Screening  Diabetes screening. This is done by checking your blood sugar (glucose) after you have not eaten for a while (fasting).  Sexually transmitted disease (STD) testing.  BRCA-related cancer screening. This may be done if you have a family history of breast, ovarian, tubal, or peritoneal cancers.  Pelvic exam and Pap test. This may be done every 3 years starting at age 21. Starting at age 30, this may be done every 5 years if you have a Pap test in combination with an HPV test. Talk with your health care provider about your test results, treatment options, and if necessary, the need for more tests.   Follow these instructions at home: Eating and drinking   Eat a diet that includes fresh fruits and vegetables, whole grains, lean protein, and low-fat dairy.  Take vitamin and mineral supplements as recommended by your health care provider.  Do not drink alcohol if: ? Your health care provider tells you not to drink. ? You are  pregnant, may be pregnant, or are planning to become pregnant.  If you drink alcohol: ? Limit how much you have to 0-1 drink a day. ? Be aware of how much alcohol is in your drink. In the [place] one drink equals one 12 oz bottle of beer (355 mL), one 5 oz glass of wine (148 mL), or one 1 oz glass of hard liquor (44 mL). Lifestyle  Take daily care of your teeth and gums.  Stay active. Exercise for at least 30 minutes on 5 or more days each week.  Do not use any products that contain nicotine or tobacco, such as cigarettes, e-cigarettes, and chewing tobacco. If you need help quitting, ask your health care provider.  If you are sexually active, practice safe sex. Use a condom or other form of birth control (contraception) in order to prevent pregnancy and STIs (sexually transmitted infections). If you plan to become pregnant, see your health care provider for a preconception visit. What's next?  Visit your health care provider once a year for a well check visit.  Ask your health care provider how often you should have your eyes and teeth checked.  Stay up to date on all vaccines. This information is not intended to replace advice given to you by your health care provider. Make sure you discuss any questions you have with your health care provider. Document Revised: 09/12/2017 Document Reviewed: 09/12/2017 Elsevier Patient Education  2020 [employer].

## 2019-05-26 NOTE — Progress Notes (Signed)
Subjective:    Tracy Stevens is a 24 y.o. G29P1001 Caucasian female who presents for a postpartum visit. She is 6 weeks postpartum following a spontaneous vaginal delivery at  39.3 gestational weeks. Anesthesia: epidural. I have fully reviewed the prenatal and intrapartum course. Postpartum course has been WNL. Baby's course has been WNL. Baby is feeding by bottle. Bleeding no bleeding. Bowel function is normal. Bladder function is normal. Patient is not sexually active. . Contraception method is condoms. Postpartum depression screening: negative Score 3  Last pap 09/13/2017 and was negative.  The following portions of the patient's history were reviewed and updated as appropriate: allergies, current medications, past medical history, past surgical history and problem list.  Review of Systems Pertinent items are noted in HPI.   There were no vitals filed for this visit. Patient's last menstrual period was 07/12/2018.  Objective:   General:  alert, cooperative and no distress   Breasts:  deferred, no complaints  Lungs: clear to auscultation bilaterally  Heart:  regular rate and rhythm  Abdomen: soft, nontender   Vulva: normal  Vagina: normal vagina  Cervix:  closed  Corpus: Well-involuted  Adnexa:  Non-palpable  Rectal Exam: no hemorrhoids        Assessment:   Postpartum exam 6 wks s/p SVD Bottle feeding Depression screening Contraception counseling   Plan:  : condoms Follow up in: 6 months for annual exam or earlier if needed  Doreene Burke, CNM

## 2019-05-30 ENCOUNTER — Ambulatory Visit: Payer: 59 | Attending: Internal Medicine

## 2019-05-30 DIAGNOSIS — Z23 Encounter for immunization: Secondary | ICD-10-CM

## 2019-05-30 NOTE — Progress Notes (Signed)
   Covid-19 Vaccination Clinic  Name:  Tracy Stevens    MRN: [id number] DOB: [birth year]  05/30/2019  Tracy Stevens was observed post Covid-19 immunization for 15 minutes without incident. She was provided with Vaccine Information Sheet and instruction to access the V-Safe system.   Tracy Stevens was instructed to call 911 with any severe reactions post vaccine: Marland Kitchen Difficulty breathing  . Swelling of face and throat  . A fast heartbeat  . A bad rash all over body  . Dizziness and weakness   Immunizations Administered    Name Date Dose VIS Date Route   Pfizer COVID-19 Vaccine 05/30/2019 10:31 AM 0.3 mL 03/11/2018 Intramuscular   Manufacturer: [employer], [employer]   Lot: [id number]   NDC: [address]-6

## 2019-06-23 ENCOUNTER — Ambulatory Visit: Payer: 59 | Attending: Internal Medicine

## 2019-06-23 DIAGNOSIS — Z23 Encounter for immunization: Secondary | ICD-10-CM

## 2019-06-23 NOTE — Progress Notes (Signed)
   Covid-19 Vaccination Clinic  Name:  Tracy Stevens    MRN: [id number] DOB: 12/14/1995  06/23/2019  [name] was observed post Covid-19 immunization for 15 minutes without incident. She was provided with Vaccine Information Sheet and instruction to access the V-Safe system.   [name] was instructed to call 911 with any severe reactions post vaccine: Marland Kitchen Difficulty breathing  . Swelling of face and throat  . A fast heartbeat  . A bad rash all over body  . Dizziness and weakness   Immunizations Administered    Name Date Dose VIS Date Route   Pfizer COVID-19 Vaccine 06/23/2019  3:39 PM 0.3 mL 03/11/2018 Intramuscular   Manufacturer: [employer], [employer]   Lot: [id number]   NDC: [address]-4

## 2019-11-30 ENCOUNTER — Encounter: Payer: 59 | Admitting: Certified Nurse Midwife

## 2019-12-02 ENCOUNTER — Encounter: Payer: Self-pay | Admitting: Certified Nurse Midwife

## 2019-12-08 ENCOUNTER — Other Ambulatory Visit (INDEPENDENT_AMBULATORY_CARE_PROVIDER_SITE_OTHER): Payer: 59

## 2019-12-08 ENCOUNTER — Ambulatory Visit (INDEPENDENT_AMBULATORY_CARE_PROVIDER_SITE_OTHER): Payer: 59 | Admitting: Certified Nurse Midwife

## 2019-12-08 ENCOUNTER — Encounter: Payer: Self-pay | Admitting: Certified Nurse Midwife

## 2019-12-08 ENCOUNTER — Other Ambulatory Visit: Payer: Self-pay | Admitting: Certified Nurse Midwife

## 2019-12-08 ENCOUNTER — Other Ambulatory Visit: Payer: Self-pay

## 2019-12-08 VITALS — BP 133/73 | HR 93 | Wt 183.0 lb

## 2019-12-08 DIAGNOSIS — N926 Irregular menstruation, unspecified: Secondary | ICD-10-CM

## 2019-12-08 DIAGNOSIS — Z3A10 10 weeks gestation of pregnancy: Secondary | ICD-10-CM | POA: Diagnosis not present

## 2019-12-08 DIAGNOSIS — Z3401 Encounter for supervision of normal first pregnancy, first trimester: Secondary | ICD-10-CM

## 2019-12-08 DIAGNOSIS — Z3A38 38 weeks gestation of pregnancy: Secondary | ICD-10-CM

## 2019-12-08 MED ORDER — ASPIRIN EC 81 MG PO TBEC: 81.0000 mg | DELAYED_RELEASE_TABLET | Freq: Every day | ORAL | 11 refills | Status: DC

## 2019-12-08 NOTE — Progress Notes (Signed)
NEW OB HISTORY AND PHYSICAL  SUBJECTIVE:       Tracy Stevens is a 24 y.o. G16P1001 female, No LMP recorded., Estimated Date of Delivery: None noted., Unknown, presents today for establishment of Prenatal Care. She has no unusual complaints .    Gynecologic History No LMP recorded. unsure of dates thinks LMP 09/14/19 Contraception: none Last Pap: 09/13/2017 Results were: normal  Obstetric History OB History  Gravida Para Term Preterm AB Living  1 1 1     1   SAB TAB Ectopic Multiple Live Births        0 1    # Outcome Date GA Lbr Len/2nd Weight Sex Delivery Anes PTL Lv  1 Term 04/14/19 [redacted]w[redacted]d 18:26 / 00:28 6 lb 14.1 oz (3.12 kg) F Vag-Spont EPI  LIV    Past Medical History:  Diagnosis Date  . Congenital enlarged kidney   . Headache     Past Surgical History:  Procedure Laterality Date  . NASAL SINUS SURGERY      Current Outpatient Medications on File Prior to Visit  Medication Sig Dispense Refill  . acetaminophen (TYLENOL) 325 MG tablet Take 650 mg by mouth every 6 (six) hours as needed.    [redacted]w[redacted]d ibuprofen (ADVIL) 600 MG tablet Take 1 tablet (600 mg total) by mouth every 6 (six) hours. 30 tablet 0  . Prenatal Vit-Fe Fumarate-FA (PRENATAL MULTIVITAMIN) TABS tablet Take 1 tablet by mouth daily at 12 noon.    . docusate sodium (COLACE) 100 MG capsule Take 1 capsule (100 mg total) by mouth 2 (two) times daily. (Patient not taking: Reported on 04/28/2019) 10 capsule 0  . ferrous sulfate 325 (65 FE) MG tablet Take 1 tablet (325 mg total) by mouth daily with breakfast. (Patient not taking: Reported on 04/28/2019) 30 tablet 3   No current facility-administered medications on file prior to visit.    No Known Allergies  Social History   Socioeconomic History  . Marital status: Married    Spouse name: 04/30/2019  . Number of children: 0  . Years of education: 64  . Highest education level: High school graduate  Occupational History  . Occupation: CNA  Tobacco Use  . Smoking  status: Never Smoker  . Smokeless tobacco: Never Used  Vaping Use  . Vaping Use: Never used  Substance and Sexual Activity  . Alcohol use: No  . Drug use: Never  . Sexual activity: Yes    Birth control/protection: None  Other Topics Concern  . Not on file  Social History Narrative  . Not on file   Social Determinants of Health   Financial Resource Strain:   . Difficulty of Paying Living Expenses: Not on file  Food Insecurity:   . Worried About 14 in the Last Year: Not on file  . Ran Out of Food in the Last Year: Not on file  Transportation Needs:   . Lack of Transportation (Medical): Not on file  . Lack of Transportation (Non-Medical): Not on file  Physical Activity:   . Days of Exercise per Week: Not on file  . Minutes of Exercise per Session: Not on file  Stress:   . Feeling of Stress : Not on file  Social Connections:   . Frequency of Communication with Friends and Family: Not on file  . Frequency of Social Gatherings with Friends and Family: Not on file  . Attends Religious Services: Not on file  . Active Member of Clubs or Organizations: Not on  file  . Attends Banker Meetings: Not on file  . Marital Status: Not on file  Intimate Partner Violence:   . Fear of Current or Ex-Partner: Not on file  . Emotionally Abused: Not on file  . Physically Abused: Not on file  . Sexually Abused: Not on file    Family History  Problem Relation Age of Onset  . Stroke Mother   . Multiple sclerosis Mother   . Diabetes Maternal Grandmother   . Cancer Maternal Grandmother   . Diabetes Maternal Grandfather   . Diabetes Paternal Grandfather   . Heart disease Paternal Grandfather   . Asthma Sister   . Epilepsy Sister     The following portions of the patient's history were reviewed and updated as appropriate: allergies, current medications, past OB history, past medical history, past surgical history, past family history, past social history, and  problem list.    OBJECTIVE: Initial Physical Exam (New OB)  GENERAL APPEARANCE: alert, well appearing, in no apparent distress, oriented to person, place and time HEAD: normocephalic, atraumatic MOUTH: mucous membranes moist, pharynx normal without lesions THYROID: no thyromegaly or masses present BREASTS: no masses noted, no significant tenderness, no palpable axillary nodes, no skin changes LUNGS: clear to auscultation, no wheezes, rales or rhonchi, symmetric air entry HEART: regular rate and rhythm, no murmurs ABDOMEN: soft, nontender, nondistended, no abnormal masses, no epigastric pain EXTREMITIES: no redness or tenderness in the calves or thighs, no edema, no limitation in range of motion, intact peripheral pulses SKIN: normal coloration and turgor, no rashes LYMPH NODES: no adenopathy palpable NEUROLOGIC: alert, oriented, normal speech, no focal findings or movement disorder noted  PELVIC EXAM Deferred, has tested pelvis. Not due for pap smear   Patient Name: Tracy Stevens DOB: July 26, 1995 MRN: [id number] ULTRASOUND REPORT  Location: [facility] Date of Service: 12/08/2019   Indications:dating Findings:  Mason Jim intrauterine pregnancy is visualized with a CRL consistent with [redacted]w[redacted]d gestation, giving an (U/S) EDD of 06/29/2020.   FHR: 165 BPM CRL measurement: 39.2 mm Yolk sac is visualized and appears normal and early anatomy is normal. Amnion: visualized and appears normal   Right Ovary is normal in appearance. Left Ovary is normal appearance. Corpus luteal cyst:  is not visualized Survey of the adnexa demonstrates no adnexal masses. There is no free peritoneal fluid in the cul de sac.  Impression: 1. [redacted]w[redacted]d Viable Singleton Intrauterine pregnancy by U/S. 2. (U/S) EDD is consistent with Clinically established EDD of 06/29/2020.  Recommendations: 1.Clinical correlation with the patient's History and Physical Exam.  Jenine M. Marciano Sequin      RDMS ASSESSMENT: Normal pregnancy  PLAN: Prenatal care See ordersNew OB counseling: The patient has been given an overview regarding routine prenatal care. Recommendations regarding diet, weight gain, and exercise in pregnancy were given. Prenatal testing, optional genetic testing,carrier screening testing, and ultrasound use in pregnancy were reviewed. Benefits of Breast Feeding were discussed. The patient is encouraged to consider nursing her baby post partum.  Doreene Burke, CNM

## 2019-12-08 NOTE — Progress Notes (Signed)
Pt presents for NOB nurse interview visit. Pregnancy confirmation done 12/08/19. G2 . P1001  . Pregnancy education material explained and given. 0 cats in the home. NOB labs ordered. (TSH/HbgA1c due to increased BMI).Marland Kitchen HIV labs and Drug screen were explained optional and she did not decline. Drug screen ordered. PNV encouraged. Genetic screening options discussed . Genetic testing: Ordered. Pt. To follow up with provider in 4/5 weeks for [facility] Financial policy reviewed. FMLA paperwork policy reviewed and signed. All questions answered.

## 2019-12-08 NOTE — Patient Instructions (Signed)

## 2019-12-09 LAB — VARICELLA ZOSTER ANTIBODY, IGG: Varicella zoster IgG: 300 index (ref >165)

## 2019-12-09 LAB — CBC WITH DIFFERENTIAL/PLATELET
Basophils Absolute: 0.1 10*3/uL (ref 0.0–0.2)
Basos: 1 %
EOS (ABSOLUTE): 0.2 10*3/uL (ref 0.0–0.4)
Eos: 2 %
Hematocrit: 41 % (ref 34.0–46.6)
Hemoglobin: 14.3 g/dL (ref 11.1–15.9)
Immature Grans (Abs): 0.1 10*3/uL (ref 0.0–0.1)
Immature Granulocytes: 1 %
Lymphocytes Absolute: 1.9 10*3/uL (ref 0.7–3.1)
Lymphs: 23 %
MCH: 30.8 pg (ref 26.6–33.0)
MCHC: 34.9 g/dL (ref 31.5–35.7)
MCV: 88 fL (ref 79–97)
Monocytes Absolute: 0.5 10*3/uL (ref 0.1–0.9)
Monocytes: 6 %
Neutrophils Absolute: 5.6 10*3/uL (ref 1.4–7.0)
Neutrophils: 67 %
Platelets: 285 10*3/uL (ref 150–450)
RBC: 4.65 x10E6/uL (ref 3.77–5.28)
RDW: 12.8 % (ref 11.7–15.4)
WBC: 8.3 10*3/uL (ref 3.4–10.8)

## 2019-12-09 LAB — URINALYSIS, ROUTINE W REFLEX MICROSCOPIC
Bilirubin, UA: NEGATIVE
Glucose, UA: NEGATIVE
Leukocytes,UA: NEGATIVE
Nitrite, UA: NEGATIVE
RBC, UA: NEGATIVE
Specific Gravity, UA: 1.027 (ref 1.005–1.030)
Urobilinogen, Ur: 1 mg/dL (ref 0.2–1.0)
pH, UA: 6 (ref 5.0–7.5)

## 2019-12-09 LAB — HEMOGLOBIN A1C
Est. average glucose Bld gHb Est-mCnc: 105 mg/dL
Hgb A1c MFr Bld: 5.3 % (ref 4.8–5.6)

## 2019-12-09 LAB — HIV ANTIBODY (ROUTINE TESTING W REFLEX): HIV Screen 4th Generation wRfx: NONREACTIVE

## 2019-12-09 LAB — TSH: TSH: 2.31 u[IU]/mL (ref 0.450–4.500)

## 2019-12-09 LAB — RUBELLA SCREEN: Rubella Antibodies, IGG: 1.35 index (ref >0.99)

## 2019-12-09 LAB — RPR: RPR Ser Ql: NONREACTIVE

## 2019-12-09 LAB — ANTIBODY SCREEN: Antibody Screen: NEGATIVE

## 2019-12-09 LAB — HEPATITIS B SURFACE ANTIGEN: Hepatitis B Surface Ag: NEGATIVE

## 2019-12-10 LAB — MONITOR DRUG PROFILE 14(MW)
Amphetamine Scrn, Ur: NEGATIVE ng/mL
BARBITURATE SCREEN URINE: NEGATIVE ng/mL
BENZODIAZEPINE SCREEN, URINE: NEGATIVE ng/mL
Buprenorphine, Urine: NEGATIVE ng/mL
CANNABINOIDS UR QL SCN: NEGATIVE ng/mL
Cocaine (Metab) Scrn, Ur: NEGATIVE ng/mL
Creatinine(Crt), U: 232.5 mg/dL (ref 20.0–300.0)
Fentanyl, Urine: NEGATIVE pg/mL
Meperidine Screen, Urine: NEGATIVE ng/mL
Methadone Screen, Urine: NEGATIVE ng/mL
OXYCODONE+OXYMORPHONE UR QL SCN: NEGATIVE ng/mL
Opiate Scrn, Ur: NEGATIVE ng/mL
Ph of Urine: 6 (ref 4.5–8.9)
Phencyclidine Qn, Ur: NEGATIVE ng/mL
Propoxyphene Scrn, Ur: NEGATIVE ng/mL
SPECIFIC GRAVITY: 1.027
Tramadol Screen, Urine: NEGATIVE ng/mL

## 2019-12-10 LAB — GC/CHLAMYDIA PROBE AMP
Chlamydia trachomatis, NAA: NEGATIVE
Neisseria Gonorrhoeae by PCR: NEGATIVE

## 2019-12-11 ENCOUNTER — Other Ambulatory Visit: Payer: Self-pay | Admitting: Certified Nurse Midwife

## 2019-12-11 MED ORDER — FUSION PLUS PO CAPS: 1.0000 | ORAL_CAPSULE | Freq: Every day | ORAL | 9 refills | Status: DC

## 2019-12-12 LAB — URINE CULTURE

## 2019-12-13 ENCOUNTER — Other Ambulatory Visit: Payer: Self-pay | Admitting: Certified Nurse Midwife

## 2019-12-13 MED ORDER — NITROFURANTOIN MONOHYD MACRO 100 MG PO CAPS: 100.0000 mg | ORAL_CAPSULE | Freq: Two times a day (BID) | ORAL | 0 refills | Status: AC

## 2019-12-15 ENCOUNTER — Telehealth: Payer: Self-pay

## 2019-12-15 NOTE — Telephone Encounter (Signed)
mychart message

## 2019-12-18 ENCOUNTER — Telehealth: Payer: Self-pay

## 2019-12-18 NOTE — Telephone Encounter (Signed)
mychart message sent to patient

## 2020-01-05 ENCOUNTER — Telehealth: Payer: Self-pay

## 2020-01-05 NOTE — Telephone Encounter (Signed)
Pt called in and stated that she test positive for Covid. Pt test positive for Covid today. The pt stated that she has an appointment on the 30 th and that her quarantinewill be up then. I told the pt I will cancel your appointment and the nurse will be in touch with you. The pt does have questions about what medicines she can and cant take. Pt was asked who she saw last and stated she saw Pattricia Boss. Please advise

## 2020-01-05 NOTE — Telephone Encounter (Signed)
mychart message sent to patient- please refer to the OTC sheet for safe medications to take.

## 2020-01-14 ENCOUNTER — Encounter: Payer: 59 | Admitting: Certified Nurse Midwife

## 2020-01-16 NOTE — L&D Delivery Note (Signed)
Delivery Note  Called in room by patient due to increased pelvic pressure and urge to push.   Coached maternal pushing efforts started at 1553.   Spontaneous vaginal birth of liveborn female infant in left occiput anterior position followed by compound left arm with tight single nuchal double clamped and cut over intact perineum at 1558. Infant immediately to maternal abdomen. Cord blood collected. APGARs: 6, 8. Weight pending. Receiving nurse present at bedside for birth.   Pitocin bolus infusing, see MAR. Delivery of intact placenta at 1602. Uterus firm. Rubra small. Bilateral labial lacerations repaired with 3-0 vicryl rapide under epidural anesthesia. Lacerations well approximated and hemostatic. Vault check completed. QBL: 200 ml.   Initiate routine postpartum care and orders. Mom to postpartum.  Baby to Couplet care / Skin to Skin.  FOB and patient's mother present at bedside and overjoyed with the birth of "[place]".    Serafina Royals, CNM [facility], [facility] 06/28/2020, 4:34 PM

## 2020-01-28 ENCOUNTER — Telehealth: Payer: Self-pay

## 2020-01-28 NOTE — Telephone Encounter (Signed)
mychart message sent to patient re: no visitors 

## 2020-01-29 ENCOUNTER — Encounter: Payer: Self-pay | Admitting: Certified Nurse Midwife

## 2020-01-29 ENCOUNTER — Other Ambulatory Visit: Payer: Self-pay

## 2020-01-29 ENCOUNTER — Ambulatory Visit (INDEPENDENT_AMBULATORY_CARE_PROVIDER_SITE_OTHER): Payer: 59 | Admitting: Certified Nurse Midwife

## 2020-01-29 VITALS — BP 113/7 | HR 81 | Wt 180.5 lb

## 2020-01-29 DIAGNOSIS — Z87440 Personal history of urinary (tract) infections: Secondary | ICD-10-CM

## 2020-01-29 DIAGNOSIS — Z3401 Encounter for supervision of normal first pregnancy, first trimester: Secondary | ICD-10-CM

## 2020-01-29 DIAGNOSIS — Z3A18 18 weeks gestation of pregnancy: Secondary | ICD-10-CM

## 2020-01-29 LAB — POCT URINALYSIS DIPSTICK OB
Bilirubin, UA: NEGATIVE
Blood, UA: NEGATIVE
Glucose, UA: NEGATIVE
Ketones, UA: NEGATIVE
Leukocytes, UA: NEGATIVE
Nitrite, UA: NEGATIVE
POC,PROTEIN,UA: NEGATIVE
Spec Grav, UA: 1.01 (ref 1.010–1.025)
Urobilinogen, UA: 0.2 E.U./dL
pH, UA: 8 (ref 5.0–8.0)

## 2020-01-29 NOTE — Patient Instructions (Addendum)
WHAT OB PATIENTS CAN EXPECT   Confirmation of pregnancy and ultrasound ordered if medically indicated-[redacted] weeks gestation  New OB (NOB) intake with nurse and New OB (NOB) labs- [redacted] weeks gestation  New OB (NOB) physical examination with provider- 11/[redacted] weeks gestation  Flu vaccine-[redacted] weeks gestation  Anatomy scan-[redacted] weeks gestation  Glucose tolerance test, blood work to test for anemia, T-dap vaccine-[redacted] weeks gestation  Vaginal swabs/cultures-STD/Group B strep-[redacted] weeks gestation  Appointments every 4 weeks until 28 weeks  Every 2 weeks from 28 weeks until 36 weeks  Weekly visits from 36 weeks until delivery    Round Ligament Pain  The round ligament is a cord of muscle and tissue that helps support the uterus. It can become a source of pain during pregnancy if it becomes stretched or twisted as the baby grows. The pain usually begins in the second trimester (13-28 weeks) of pregnancy, and it can come and go until the baby is delivered. It is not a serious problem, and it does not cause harm to the baby. Round ligament pain is usually a short, sharp, and pinching pain, but it can also be a dull, lingering, and aching pain. The pain is felt in the lower side of the abdomen or in the groin. It usually starts deep in the groin and moves up to the outside of the hip area. The pain may occur when you:  Suddenly change position, such as quickly going from a sitting to standing position.  Roll over in bed.  Cough or sneeze.  Do physical activity. Follow these instructions at home:  Watch your condition for any changes.  When the pain starts, relax. Then try any of these methods to help with the pain: ? Sitting down. ? Flexing your knees up to your abdomen. ? Lying on your side with one pillow under your abdomen and another pillow between your legs. ? Sitting in a warm bath for 15-20 minutes or until the pain goes away.  Take over-the-counter and prescription medicines only as told  by your health care provider.  Move slowly when you sit down or stand up.  Avoid long walks if they cause pain.  Stop or reduce your physical activities if they cause pain.  Keep all follow-up visits as told by your health care provider. This is important.   Contact a health care provider if:  Your pain does not go away with treatment.  You feel pain in your back that you did not have before.  Your medicine is not helping. Get help right away if:  You have a fever or chills.  You develop uterine contractions.  You have vaginal bleeding.  You have nausea or vomiting.  You have diarrhea.  You have pain when you urinate. Summary  Round ligament pain is felt in the lower abdomen or groin. It is usually a short, sharp, and pinching pain. It can also be a dull, lingering, and aching pain.  This pain usually begins in the second trimester (13-28 weeks). It occurs because the uterus is stretching with the growing baby, and it is not harmful to the baby.  You may notice the pain when you suddenly change position, when you cough or sneeze, or during physical activity.  Relaxing, flexing your knees to your abdomen, lying on one side, or taking a warm bath may help to get rid of the pain.  Get help from your health care provider if the pain does not go away or if you have vaginal  bleeding, nausea, vomiting, diarrhea, or painful urination. This information is not intended to replace advice given to you by your health care provider. Make sure you discuss any questions you have with your health care provider. Document Revised: 06/19/2017 Document Reviewed: 06/19/2017 Elsevier Patient Education  2021 [employer].

## 2020-01-30 NOTE — Progress Notes (Signed)
ROB-Feeling much better. Diagnosed with COVID on 01/04/20; vaccinated but not boosted-cold symptoms only. Anticipatory guidance regarding course of prenatal care. Reviewed red flag symptoms and when to call. RTC x 2 weeks ANATOMY SCAN and ROB or sooner if needed.

## 2020-02-02 LAB — URINE CULTURE

## 2020-02-17 ENCOUNTER — Ambulatory Visit (INDEPENDENT_AMBULATORY_CARE_PROVIDER_SITE_OTHER): Payer: 59 | Admitting: Certified Nurse Midwife

## 2020-02-17 ENCOUNTER — Ambulatory Visit (INDEPENDENT_AMBULATORY_CARE_PROVIDER_SITE_OTHER): Payer: 59

## 2020-02-17 ENCOUNTER — Other Ambulatory Visit: Payer: Self-pay

## 2020-02-17 ENCOUNTER — Encounter: Payer: Self-pay | Admitting: Certified Nurse Midwife

## 2020-02-17 VITALS — BP 98/54 | HR 75 | Wt 182.6 lb

## 2020-02-17 DIAGNOSIS — Z3A21 21 weeks gestation of pregnancy: Secondary | ICD-10-CM

## 2020-02-17 DIAGNOSIS — Z3401 Encounter for supervision of normal first pregnancy, first trimester: Secondary | ICD-10-CM | POA: Diagnosis not present

## 2020-02-17 DIAGNOSIS — Z3A18 18 weeks gestation of pregnancy: Secondary | ICD-10-CM

## 2020-02-17 LAB — POCT URINALYSIS DIPSTICK OB
Bilirubin, UA: NEGATIVE
Blood, UA: NEGATIVE
Glucose, UA: NEGATIVE
Ketones, UA: NEGATIVE
Leukocytes, UA: NEGATIVE
Nitrite, UA: NEGATIVE
POC,PROTEIN,UA: NEGATIVE
Spec Grav, UA: 1.01 (ref 1.010–1.025)
Urobilinogen, UA: 0.2 E.U./dL
pH, UA: 5 (ref 5.0–8.0)

## 2020-02-17 NOTE — Progress Notes (Signed)
ROB and anatomy u/s today ( see below, results reviewed). She feels good movement. She is having episodes of tachycardia . She has  Hx of tachycardia and was seen by cardiology last pregnancy. Discussed with pt red flags symptoms and encourage consulting with cardiologist again if she is having increasing symptoms, light headedness , SOB, or chest pain. She verbalizes and agree. Follow up 3 wks for completion of anatomy scan and ROB with [facility].   Doreene Burke, CNM   Patient Name: Tracy Stevens DOB: August 11, 1995 MRN: [id number]   ULTRASOUND REPORT  Location: [facility] Date of Service: 02/17/2020   Indications:Anatomy Ultrasound   Findings:  Tracy Stevens intrauterine pregnancy is visualized with FHR at 147 BPM.   Biometrics give an (U/S) Gestational age of [redacted]w[redacted]d and an (U/S) EDD of 07/01/2020; this correlates with the clinically established Estimated Date of Delivery: 06/29/20   Fetal presentation is Breech.  EFW: 370 g ( 13 oz ). Placenta: posterior fundal Grade: 1 AFI: subjectively normal.  Anatomic survey is incomplete for Lumbar and Sacral Spine, 4CH, 3VV, Aorta, DA and normal; Gender - female.    Impression: 1. [redacted]w[redacted]d Viable Singleton Intrauterine pregnancy by U/S. 2. (U/S) EDD is consistent with Clinically established Estimated Date of Delivery: 06/29/20, [redacted]w[redacted]d . 3. Incomplete Anatomy scan.   Recommendations: 1.Clinical correlation with the patient's History and Physical Exam.    Deanna Artis, RT

## 2020-02-17 NOTE — Patient Instructions (Signed)

## 2020-02-23 ENCOUNTER — Telehealth: Payer: Self-pay | Admitting: Certified Nurse Midwife

## 2020-02-23 NOTE — Telephone Encounter (Signed)
Spoke with patient made her aware maternity benefit are covered 100%. Pt verbalized understanding.

## 2020-03-17 ENCOUNTER — Other Ambulatory Visit: Payer: 59

## 2020-03-17 ENCOUNTER — Other Ambulatory Visit: Payer: Self-pay

## 2020-03-17 ENCOUNTER — Ambulatory Visit (INDEPENDENT_AMBULATORY_CARE_PROVIDER_SITE_OTHER): Payer: 59 | Admitting: Certified Nurse Midwife

## 2020-03-17 VITALS — BP 128/78 | HR 107 | Wt 187.4 lb

## 2020-03-17 DIAGNOSIS — O26892 Other specified pregnancy related conditions, second trimester: Secondary | ICD-10-CM

## 2020-03-17 DIAGNOSIS — Z3402 Encounter for supervision of normal first pregnancy, second trimester: Secondary | ICD-10-CM

## 2020-03-17 DIAGNOSIS — Z3A25 25 weeks gestation of pregnancy: Secondary | ICD-10-CM

## 2020-03-17 DIAGNOSIS — R102 Pelvic and perineal pain: Secondary | ICD-10-CM

## 2020-03-17 LAB — POCT URINALYSIS DIPSTICK OB
Appearance: NORMAL
Bilirubin, UA: NEGATIVE
Blood, UA: NEGATIVE
Glucose, UA: NEGATIVE
Ketones, UA: NEGATIVE
Leukocytes, UA: NEGATIVE
Nitrite, UA: NEGATIVE
Odor: NORMAL
POC,PROTEIN,UA: NEGATIVE
Spec Grav, UA: 1.01 (ref 1.010–1.025)
Urobilinogen, UA: 0.2 E.U./dL
pH, UA: 7 (ref 5.0–8.0)

## 2020-03-17 NOTE — Progress Notes (Signed)
ROB : she complains of lower pelvic pain and back pain. She said it feels like pinching and punching.

## 2020-03-17 NOTE — Progress Notes (Signed)
ROB- Doing well overall. Reports pelvic pinching pain that worsens during 12 hour shift. Notes mild relief with home treatment measures like tylenol and abdominal support. Discussed heating patch, lidocaine patch, and flexeril. Patient decided to try lidocaine patch and will let us know if it helps. Anticipatory guidance regarding course of prenatal care. Reviewed red flag symptoms and when to call. RTC x 3-4 weeks for 28 week labs, discuss ultrasound results, and ROB with ANNIE or sooner if needed.    Juliann Pares, Student-MidWife [employer]  03/17/20 10:35 AM

## 2020-03-17 NOTE — Progress Notes (Signed)
I have seen, interviewed, and examined the patient in conjunction with the [employer] and affirm the diagnosis and management plan.   Gunnar Bulla, CNM [facility], [facility] 03/17/20 11:01 AM

## 2020-03-17 NOTE — Patient Instructions (Signed)
Lidocaine dermal patch What is this medicine? LIDOCAINE (LYE doe kane) causes loss of feeling in the skin and surrounding area. The medicine helps treat pain, including nerve pain. This medicine may be used for other purposes; ask your health care provider or pharmacist if you have questions. COMMON BRAND NAME(S): Aspercreme with Lidocaine, Blue-Emu, DERMALID, GEN7T, Lidocare, Lidoderm, LidoReal-30, Salonpas Lidocaine, ZTlido What should I tell my health care provider before I take this medicine? They need to know if you have any of these conditions:  heart disease  history of irregular heart beat  liver disease  skin conditions or sensitivity  skin infection  an unusual or allergic reaction to lidocaine, parabens, other medicines, foods, dyes, or preservatives  pregnant or trying to get pregnant  breast-feeding How should I use this medicine? This medicine is for external use only. Follow the directions on the prescription label or package. Talk to your pediatrician regarding the use of this medicine in children. Special care may be needed. Overdosage: If you think you have taken too much of this medicine contact a poison control center or emergency room at once. NOTE: This medicine is only for you. Do not share this medicine with others. What if I miss a dose? If you miss a dose, take it as soon as you can. If it is almost time for your next dose, take only that dose. Do not take double or extra doses. What may interact with this medicine? Do not take this medicine with any of the following medications:  certain medicines for irregular heart beat  MAOIs like Carbex, Eldepryl, Marplan, Nardil, and Parnate This medicine may also interact with the following medications:  other local anesthetics like pramoxine, tetracaine Do not use any other skin products on the affected area without asking your doctor or health care professional. This list may not describe all possible  interactions. Give your health care provider a list of all the medicines, herbs, non-prescription drugs, or dietary supplements you use. Also tell them if you smoke, drink alcohol, or use illegal drugs. Some items may interact with your medicine. What should I watch for while using this medicine? Tell your doctor or healthcare professional if your symptoms do not start to get better or if they get worse. Be careful to avoid injury while the area is numb from the medicine, and you are not aware of pain. If you are going to need surgery, a MRI, CT scan, or other procedure, tell your doctor that you are using this medicine. You may need to remove this patch before the procedure. Do not get this medicine in your eyes. If you do, rinse out with plenty of cool tap water. This medicine can make certain skin conditions worse. Only use it for conditions for which your doctor or health care professional has prescribed. What side effects may I notice from receiving this medicine? Side effects that you should report to your doctor or health care professional as soon as possible:  allergic reactions like skin rash, itching or hives, swelling of the face, lips, or tongue  breathing problems  chest pain or chest tightness  dizzines Side effects that usually do not require medical attention (report to your doctor or health care professional if they continue or are bothersome):  tingling, numbness at site where applied This list may not describe all possible side effects. Call your doctor for medical advice about side effects. You may report side effects to FDA at 1-800-FDA-1088. Where should I keep my medicine?  Keep out of the reach of children. See product for storage instructions. Each product may have different instructions. NOTE: This sheet is a summary. It may not cover all possible information. If you have questions about this medicine, talk to your doctor, pharmacist, or health care provider.  2021  Elsevier/Gold Standard (2016-10-03 22:50:48)   Fetal Movement Counts Patient Name: ________________________________________________ Patient Due Date: ____________________  What is a fetal movement count? A fetal movement count is the number of times that you feel your baby move during a certain amount of time. This may also be called a fetal kick count. A fetal movement count is recommended for every pregnant woman. You may be asked to start counting fetal movements as early as week 28 of your pregnancy. Pay attention to when your baby is most active. You may notice your baby's sleep and wake cycles. You may also notice things that make your baby move more. You should do a fetal movement count:  When your baby is normally most active.  At the same time each day. A good time to count movements is while you are resting, after having something to eat and drink. How do I count fetal movements? 1. Find a quiet, comfortable area. Sit, or lie down on your side. 2. Write down the date, the start time and stop time, and the number of movements that you felt between those two times. Take this information with you to your health care visits. 3. Write down your start time when you feel the first movement. 4. Count kicks, flutters, swishes, rolls, and jabs. You should feel at least 10 movements. 5. You may stop counting after you have felt 10 movements, or if you have been counting for 2 hours. Write down the stop time. 6. If you do not feel 10 movements in 2 hours, contact your health care provider for further instructions. Your health care provider may want to do additional tests to assess your baby's well-being. Contact a health care provider if:  You feel fewer than 10 movements in 2 hours.  Your baby is not moving like he or she usually does. Date: ____________ Start time: ____________ Stop time: ____________ Movements: ____________ Date: ____________ Start time: ____________ Stop time:  ____________ Movements: ____________ Date: ____________ Start time: ____________ Stop time: ____________ Movements: ____________ Date: ____________ Start time: ____________ Stop time: ____________ Movements: ____________ Date: ____________ Start time: ____________ Stop time: ____________ Movements: ____________ Date: ____________ Start time: ____________ Stop time: ____________ Movements: ____________ Date: ____________ Start time: ____________ Stop time: ____________ Movements: ____________ Date: ____________ Start time: ____________ Stop time: ____________ Movements: ____________ Date: ____________ Start time: ____________ Stop time: ____________ Movements: ____________ This information is not intended to replace advice given to you by your health care provider. Make sure you discuss any questions you have with your health care provider. Document Revised: 08/21/2018 Document Reviewed: 08/21/2018 Elsevier Patient Education  2021 Elsevier Inc.    Second Trimester of Pregnancy  The second trimester of pregnancy is from week 13 through week 27. This is also called months 4 through 6 of pregnancy. This is often the time when you feel your best. During the second trimester:  Morning sickness is less or has stopped.  You may have more energy.  You may feel hungry more often. At this time, your unborn baby (fetus) is growing very fast. At the end of the sixth month, the unborn baby may be up to 12 inches long and weigh about 1 pounds. You will likely start  to feel the baby move between 16 and 20 weeks of pregnancy. Body changes during your second trimester Your body continues to go through many changes during this time. The changes vary and generally return to normal after the baby is born. Physical changes  You will gain more weight.  You may start to get stretch marks on your hips, belly (abdomen), and breasts.  Your breasts will grow and may hurt.  Dark spots or blotches may develop  on your face.  A dark line from your belly button to the pubic area (linea nigra) may appear.  You may have changes in your hair. Health changes  You may have headaches.  You may have heartburn.  You may have trouble pooping (constipation).  You may have hemorrhoids or swollen, bulging veins (varicose veins).  Your gums may bleed.  You may pee (urinate) more often.  You may have back pain. Follow these instructions at home: Medicines  Take over-the-counter and prescription medicines only as told by your doctor. Some medicines are not safe during pregnancy.  Take a prenatal vitamin that contains at least 600 micrograms (mcg) of folic acid. Eating and drinking  Eat healthy meals that include: ? Fresh fruits and vegetables. ? Whole grains. ? Good sources of protein, such as meat, eggs, or tofu. ? Low-fat dairy products.  Avoid raw meat and unpasteurized juice, milk, and cheese.  You may need to take these actions to prevent or treat trouble pooping: ? Drink enough fluids to keep your pee (urine) pale yellow. ? Eat foods that are high in fiber. These include beans, whole grains, and fresh fruits and vegetables. ? Limit foods that are high in fat and sugar. These include fried or sweet foods. Activity  Exercise only as told by your doctor. Most people can do their usual exercise during pregnancy. Try to exercise for 30 minutes at least 5 days a week.  Stop exercising if you have pain or cramps in your belly or lower back.  Do not exercise if it is too hot or too humid, or if you are in a place of great height (high altitude).  Avoid heavy lifting.  If you choose to, you may have sex unless your doctor tells you not to. Relieving pain and discomfort  Wear a good support bra if your breasts are sore.  Take warm water baths (sitz baths) to soothe pain or discomfort caused by hemorrhoids. Use hemorrhoid cream if your doctor approves.  Rest with your legs raised  (elevated) if you have leg cramps or low back pain.  If you develop bulging veins in your legs: ? Wear support hose as told by your doctor. ? Raise your feet for 15 minutes, 3-4 times a day. ? Limit salt in your food. Safety  Wear your seat belt at all times when you are in a car.  Talk with your doctor if someone is hurting you or yelling at you a lot. Lifestyle  Do not use hot tubs, steam rooms, or saunas.  Do not douche. Do not use tampons or scented sanitary pads.  Avoid cat litter boxes and soil used by cats. These carry germs that can harm your baby and can cause a loss of your baby by miscarriage or stillbirth.  Do not use herbal medicines, illegal drugs, or medicines that are not approved by your doctor. Do not drink alcohol.  Do not smoke or use any products that contain nicotine or tobacco. If you need help quitting, ask your  doctor. General instructions  Keep all follow-up visits. This is important.  Ask your doctor about local prenatal classes.  Ask your doctor about the right foods to eat or for help finding a counselor. Where to find more information  American Pregnancy Association: americanpregnancy.org  [employer] of [employer]stetricians and Gynecologists: www.acog.org  Office on [employer] Health: [employer] Contact a doctor if:  You have a headache that does not go away when you take medicine.  You have changes in how you see, or you see spots in front of your eyes.  You have mild cramps, pressure, or pain in your lower belly.  You continue to feel like you may vomit (nauseous), you vomit, or you have watery poop (diarrhea).  You have bad-smelling fluid coming from your vagina.  You have pain when you pee or your pee smells bad.  You have very bad swelling of your face, hands, ankles, feet, or legs.  You have a fever. Get help right away if:  You are leaking fluid from your vagina.  You have spotting or bleeding from your  vagina.  You have very bad belly cramping or pain.  You have trouble breathing.  You have chest pain.  You faint.  You have not felt your baby move for the time period told by your doctor.  You have new or increased pain, swelling, or redness in an arm or leg. Summary  The second trimester of pregnancy is from week 13 through week 27 (months 4 through 6).  Eat healthy meals.  Exercise as told by your doctor. Most people can do their usual exercise during pregnancy.  Do not use herbal medicines, illegal drugs, or medicines that are not approved by your doctor. Do not drink alcohol.  Call your doctor if you get sick or if you notice anything unusual about your pregnancy. This information is not intended to replace advice given to you by your health care provider. Make sure you discuss any questions you have with your health care provider. Document Revised: 06/10/2019 Document Reviewed: 04/16/2019 Elsevier Patient Education  2021 [employer].

## 2020-03-21 ENCOUNTER — Ambulatory Visit
Admission: RE | Admit: 2020-03-21 | Discharge: 2020-03-21 | Disposition: A | Discharge status: Home / Self Care | Payer: 59 | Source: Ambulatory Visit | Attending: Certified Nurse Midwife | Admitting: Certified Nurse Midwife

## 2020-03-21 ENCOUNTER — Other Ambulatory Visit: Payer: Self-pay

## 2020-03-21 DIAGNOSIS — Z3A21 21 weeks gestation of pregnancy: Secondary | ICD-10-CM | POA: Diagnosis present

## 2020-03-21 DIAGNOSIS — Z369 Encounter for antenatal screening, unspecified: Secondary | ICD-10-CM | POA: Diagnosis present

## 2020-03-21 DIAGNOSIS — Z3A25 25 weeks gestation of pregnancy: Secondary | ICD-10-CM | POA: Diagnosis not present

## 2020-03-22 ENCOUNTER — Other Ambulatory Visit: Payer: Self-pay | Admitting: Certified Nurse Midwife

## 2020-03-22 MED ORDER — LIDOCAINE 4 % EX PTCH: 1.0000 | MEDICATED_PATCH | Freq: Two times a day (BID) | CUTANEOUS | 1 refills | Status: DC | PRN

## 2020-03-30 DIAGNOSIS — Z0289 Encounter for other administrative examinations: Secondary | ICD-10-CM

## 2020-04-11 ENCOUNTER — Ambulatory Visit (INDEPENDENT_AMBULATORY_CARE_PROVIDER_SITE_OTHER): Payer: 59 | Admitting: Certified Nurse Midwife

## 2020-04-11 ENCOUNTER — Other Ambulatory Visit: Payer: Self-pay

## 2020-04-11 ENCOUNTER — Other Ambulatory Visit: Payer: 59

## 2020-04-11 ENCOUNTER — Encounter: Payer: Self-pay | Admitting: Certified Nurse Midwife

## 2020-04-11 VITALS — BP 110/70 | HR 84 | Wt 191.3 lb

## 2020-04-11 DIAGNOSIS — Z3A28 28 weeks gestation of pregnancy: Secondary | ICD-10-CM

## 2020-04-11 DIAGNOSIS — Z3483 Encounter for supervision of other normal pregnancy, third trimester: Secondary | ICD-10-CM

## 2020-04-11 LAB — POCT URINALYSIS DIPSTICK OB
Bilirubin, UA: NEGATIVE
Blood, UA: NEGATIVE
Glucose, UA: NEGATIVE
Ketones, UA: NEGATIVE
Leukocytes, UA: NEGATIVE
Nitrite, UA: NEGATIVE
POC,PROTEIN,UA: NEGATIVE
Spec Grav, UA: <=1.005 — AB (ref 1.010–1.025)
Urobilinogen, UA: 0.2 E.U./dL
pH, UA: 8 (ref 5.0–8.0)

## 2020-04-11 NOTE — Progress Notes (Signed)
ROB doing well. 28 wk labs today. BTC reviewed and signed/Tdap/ RSB discussed, pt state she is bottle feeding and declines information.  Sample birth plan given, information on breast pump ordering given. Pamphlet on birth control given to pt as well. Considering IUD. Will follow up with lab results. Pt experiencing lightening crotch reviewed use of belly band. She verbalizes and agrees. Follow up 2 wk with Marcelino Duster.  Doreene Burke, CNM

## 2020-04-11 NOTE — Patient Instructions (Signed)
Oral Glucose Tolerance Test During Pregnancy Why am I having this test? The oral glucose tolerance test (OGTT) is done to check how your body processes blood sugar (glucose). This is one of several tests used to diagnose diabetes that develops during pregnancy (gestational diabetes mellitus). Gestational diabetes is a short-term form of diabetes that some women develop while they are pregnant. It usually occurs during the second trimester of pregnancy and goes away after delivery. Testing, or screening, for gestational diabetes usually occurs at weeks 24-28 of pregnancy. You may have the OGTT test after having a 1-hour glucose screening test if the results from that test indicate that you may have gestational diabetes. This test may also be needed if:  You have a history of gestational diabetes.  There is a history of giving birth to very large babies or of losing pregnancies (having stillbirths).  You have signs and symptoms of diabetes, such as: ? Changes in your eyesight. ? Tingling or numbness in your hands or feet. ? Changes in hunger, thirst, and urination, and these are not explained by your pregnancy. What is being tested? This test measures the amount of glucose in your blood at different times during a period of 3 hours. This shows how well your body can process glucose. What kind of sample is taken? Blood samples are required for this test. They are usually collected by inserting a needle into a blood vessel.   How do I prepare for this test?  For 3 days before your test, eat normally. Have plenty of carbohydrate-rich foods.  Follow instructions from your health care provider about: ? Eating or drinking restrictions on the day of the test. You may be asked not to eat or drink anything other than water (to fast) starting 8-10 hours before the test. ? Changing or stopping your regular medicines. Some medicines may interfere with this test. Tell a health care provider about:  All  medicines you are taking, including vitamins, herbs, eye drops, creams, and over-the-counter medicines.  Any blood disorders you have.  Any surgeries you have had.  Any medical conditions you have. What happens during the test? First, your blood glucose will be measured. This is referred to as your fasting blood glucose because you fasted before the test. Then, you will drink a glucose solution that contains a certain amount of glucose. Your blood glucose will be measured again 1, 2, and 3 hours after you drink the solution. This test takes about 3 hours to complete. You will need to stay at the testing location during this time. During the testing period:  Do not eat or drink anything other than the glucose solution.  Do not exercise.  Do not use any products that contain nicotine or tobacco, such as cigarettes, e-cigarettes, and chewing tobacco. These can affect your test results. If you need help quitting, ask your health care provider. The testing procedure may vary among health care providers and hospitals. How are the results reported? Your results will be reported as milligrams of glucose per deciliter of blood (mg/dL) or millimoles per liter (mmol/L). There is more than one source for screening and diagnosis reference values used to diagnose gestational diabetes. Your health care provider will compare your results to normal values that were established after testing a large group of people (reference values). Reference values may vary among labs and hospitals. For this test (Carpenter-Coustan), reference values are:  Fasting: 95 mg/dL (5.3 mmol/L).  1 hour: 180 mg/dL (10.0 mmol/L).  2 hour:   155 mg/dL (8.6 mmol/L).  3 hour: 140 mg/dL (7.8 mmol/L). What do the results mean? Results below the reference values are considered normal. If two or more of your blood glucose levels are at or above the reference values, you may be diagnosed with gestational diabetes. If only one level is  high, your health care provider may suggest repeat testing or other tests to confirm a diagnosis. Talk with your health care provider about what your results mean. Questions to ask your health care provider Ask your health care provider, or the department that is doing the test:  When will my results be ready?  How will I get my results?  What are my treatment options?  What other tests do I need?  What are my next steps? Summary  The oral glucose tolerance test (OGTT) is one of several tests used to diagnose diabetes that develops during pregnancy (gestational diabetes mellitus). Gestational diabetes is a short-term form of diabetes that some women develop while they are pregnant.  You may have the OGTT test after having a 1-hour glucose screening test if the results from that test show that you may have gestational diabetes. You may also have this test if you have any symptoms or risk factors for this type of diabetes.  Talk with your health care provider about what your results mean. This information is not intended to replace advice given to you by your health care provider. Make sure you discuss any questions you have with your health care provider. Document Revised: 06/11/2019 Document Reviewed: 06/11/2019 Elsevier Patient Education  2021 Elsevier Inc.  

## 2020-04-12 LAB — GLUCOSE, 1 HOUR GESTATIONAL: Gestational Diabetes Screen: 143 mg/dL — ABNORMAL HIGH (ref 65–139)

## 2020-04-12 LAB — CBC
Hematocrit: 33.5 % — ABNORMAL LOW (ref 34.0–46.6)
Hemoglobin: 11 g/dL — ABNORMAL LOW (ref 11.1–15.9)
MCH: 29.1 pg (ref 26.6–33.0)
MCHC: 32.8 g/dL (ref 31.5–35.7)
MCV: 89 fL (ref 79–97)
Platelets: 248 10*3/uL (ref 150–450)
RBC: 3.78 x10E6/uL (ref 3.77–5.28)
RDW: 12.1 % (ref 11.7–15.4)
WBC: 9.9 10*3/uL (ref 3.4–10.8)

## 2020-04-12 LAB — RPR: RPR Ser Ql: NONREACTIVE

## 2020-04-14 ENCOUNTER — Other Ambulatory Visit: Payer: Self-pay | Admitting: Certified Nurse Midwife

## 2020-04-14 DIAGNOSIS — R7309 Other abnormal glucose: Secondary | ICD-10-CM

## 2020-04-14 DIAGNOSIS — Z3483 Encounter for supervision of other normal pregnancy, third trimester: Secondary | ICD-10-CM

## 2020-04-14 DIAGNOSIS — Z3A29 29 weeks gestation of pregnancy: Secondary | ICD-10-CM

## 2020-04-14 DIAGNOSIS — Z131 Encounter for screening for diabetes mellitus: Secondary | ICD-10-CM

## 2020-04-14 NOTE — Progress Notes (Signed)
Glucose Tolerance Test ordered, see chart.    Serafina Royals, CNM [facility], [facility] 04/14/20 3:13 PM

## 2020-04-22 ENCOUNTER — Other Ambulatory Visit: Payer: 59

## 2020-04-22 ENCOUNTER — Other Ambulatory Visit: Payer: Self-pay

## 2020-04-22 DIAGNOSIS — R7309 Other abnormal glucose: Secondary | ICD-10-CM

## 2020-04-22 DIAGNOSIS — Z3483 Encounter for supervision of other normal pregnancy, third trimester: Secondary | ICD-10-CM

## 2020-04-22 DIAGNOSIS — Z3A29 29 weeks gestation of pregnancy: Secondary | ICD-10-CM

## 2020-04-22 DIAGNOSIS — Z131 Encounter for screening for diabetes mellitus: Secondary | ICD-10-CM

## 2020-04-23 LAB — GESTATIONAL GLUCOSE TOLERANCE
Glucose, Fasting: 76 mg/dL (ref 65–94)
Glucose, GTT - 1 Hour: 160 mg/dL (ref 65–179)
Glucose, GTT - 2 Hour: 145 mg/dL (ref 65–154)
Glucose, GTT - 3 Hour: 81 mg/dL (ref 65–139)

## 2020-04-28 ENCOUNTER — Ambulatory Visit (INDEPENDENT_AMBULATORY_CARE_PROVIDER_SITE_OTHER): Payer: 59 | Admitting: Certified Nurse Midwife

## 2020-04-28 ENCOUNTER — Encounter: Payer: Self-pay | Admitting: Certified Nurse Midwife

## 2020-04-28 ENCOUNTER — Other Ambulatory Visit: Payer: Self-pay

## 2020-04-28 VITALS — BP 108/68 | HR 104 | Wt 189.3 lb

## 2020-04-28 DIAGNOSIS — O09899 Supervision of other high risk pregnancies, unspecified trimester: Secondary | ICD-10-CM | POA: Insufficient documentation

## 2020-04-28 DIAGNOSIS — Z3483 Encounter for supervision of other normal pregnancy, third trimester: Secondary | ICD-10-CM

## 2020-04-28 DIAGNOSIS — Z3A31 31 weeks gestation of pregnancy: Secondary | ICD-10-CM

## 2020-04-28 LAB — POCT URINALYSIS DIPSTICK OB
Bilirubin, UA: NEGATIVE
Blood, UA: NEGATIVE
Glucose, UA: NEGATIVE
Ketones, UA: NEGATIVE
Leukocytes, UA: NEGATIVE
Nitrite, UA: NEGATIVE
POC,PROTEIN,UA: NEGATIVE
Spec Grav, UA: 1.01 (ref 1.010–1.025)
Urobilinogen, UA: 0.2 E.U./dL
pH, UA: 7 (ref 5.0–8.0)

## 2020-04-28 NOTE — Progress Notes (Signed)
ROB- Doing well overall, no concerns today. Reports left ankle has trace edema after work, right ankle is normal, this also was the noted in previous pregnancy as well. Patient desires to formula feed, declined breastfeeding education. Overview and plan completed today. Anticipatory guidance regarding course of prenatal care. Reviewed red flag symptoms and when to call. RTC x 2 weeks for ROB with ANNIE; RTC x 4 weeks for ROB with JML or sooner if needed.  Juliann Pares, Student-MidWife [employer] 04/28/20 11:21 AM

## 2020-04-28 NOTE — Progress Notes (Signed)
I have seen, interviewed, and examined the patient in conjunction with the [employer] midwife and affirm the diagnosis and management plan.   Gunnar Bulla, CNM [facility], [facility] 04/28/20 12:53 PM

## 2020-04-28 NOTE — Patient Instructions (Signed)
 Fetal Movement Counts Patient Name: ________________________________________________ Patient Due Date: ____________________  What is a fetal movement count? A fetal movement count is the number of times that you feel your baby move during a certain amount of time. This may also be called a fetal kick count. A fetal movement count is recommended for every pregnant woman. You may be asked to start counting fetal movements as early as week 28 of your pregnancy. Pay attention to when your baby is most active. You may notice your baby's sleep and wake cycles. You may also notice things that make your baby move more. You should do a fetal movement count:  When your baby is normally most active.  At the same time each day. A good time to count movements is while you are resting, after having something to eat and drink. How do I count fetal movements? 1. Find a quiet, comfortable area. Sit, or lie down on your side. 2. Write down the date, the start time and stop time, and the number of movements that you felt between those two times. Take this information with you to your health care visits. 3. Write down your start time when you feel the first movement. 4. Count kicks, flutters, swishes, rolls, and jabs. You should feel at least 10 movements. 5. You may stop counting after you have felt 10 movements, or if you have been counting for 2 hours. Write down the stop time. 6. If you do not feel 10 movements in 2 hours, contact your health care provider for further instructions. Your health care provider may want to do additional tests to assess your baby's well-being. Contact a health care provider if:  You feel fewer than 10 movements in 2 hours.  Your baby is not moving like he or she usually does. Date: ____________ Start time: ____________ Stop time: ____________ Movements: ____________ Date: ____________ Start time: ____________ Stop time: ____________ Movements: ____________ Date: ____________  Start time: ____________ Stop time: ____________ Movements: ____________ Date: ____________ Start time: ____________ Stop time: ____________ Movements: ____________ Date: ____________ Start time: ____________ Stop time: ____________ Movements: ____________ Date: ____________ Start time: ____________ Stop time: ____________ Movements: ____________ Date: ____________ Start time: ____________ Stop time: ____________ Movements: ____________ Date: ____________ Start time: ____________ Stop time: ____________ Movements: ____________ Date: ____________ Start time: ____________ Stop time: ____________ Movements: ____________ This information is not intended to replace advice given to you by your health care provider. Make sure you discuss any questions you have with your health care provider. Document Revised: 08/21/2018 Document Reviewed: 08/21/2018 Elsevier Patient Education  2021 Elsevier Inc.    Third Trimester of Pregnancy  The third trimester of pregnancy is from week 28 through week 40. This is also called months 7 through 9. This trimester is when your unborn baby (fetus) is growing very fast. At the end of the ninth month, the unborn baby is about 20 inches long. It weighs about 6-10 pounds. Body changes during your third trimester Your body continues to go through many changes during this time. The changes vary and generally return to normal after the baby is born. Physical changes  Your weight will continue to increase. You may gain 25-35 pounds (11-16 kg) by the end of the pregnancy. If you are underweight, you may gain 28-40 lb (about 13-18 kg). If you are overweight, you may gain 15-25 lb (about 7-11 kg).  You may start to get stretch marks on your hips, belly (abdomen), and breasts.  Your breasts will continue to   grow and may hurt. A yellow fluid (colostrum) may leak from your breasts. This is the first milk you are making for your baby.  You may have changes in your hair.  Your  belly button may stick out.  You may have more swelling in your hands, face, or ankles. Health changes  You may have heartburn.  You may have trouble pooping (constipation).  You may get hemorrhoids. These are swollen veins in the butt that can itch or get painful.  You may have swollen veins (varicose veins) in your legs.  You may have more body aches in the pelvis, back, or thighs.  You may have more tingling or numbness in your hands, arms, and legs. The skin on your belly may also feel numb.  You may feel short of breath as your womb (uterus) gets bigger. Other changes  You may pee (urinate) more often.  You may have more problems sleeping.  You may notice the unborn baby "dropping," or moving lower in your belly.  You may have more discharge coming from your vagina.  Your joints may feel loose, and you may have pain around your pelvic bone. Follow these instructions at home: Medicines  Take over-the-counter and prescription medicines only as told by your doctor. Some medicines are not safe during pregnancy.  Take a prenatal vitamin that contains at least 600 micrograms (mcg) of folic acid. Eating and drinking  Eat healthy meals that include: ? Fresh fruits and vegetables. ? Whole grains. ? Good sources of protein, such as meat, eggs, or tofu. ? Low-fat dairy products.  Avoid raw meat and unpasteurized juice, milk, and cheese. These carry germs that can harm you and your baby.  Eat 4 or 5 small meals rather than 3 large meals a day.  You may need to take these actions to prevent or treat trouble pooping: ? Drink enough fluids to keep your pee (urine) pale yellow. ? Eat foods that are high in fiber. These include beans, whole grains, and fresh fruits and vegetables. ? Limit foods that are high in fat and sugar. These include fried or sweet foods. Activity  Exercise only as told by your doctor. Stop exercising if you start to have cramps in your womb.  Avoid  heavy lifting.  Do not exercise if it is too hot or too humid, or if you are in a place of great height (high altitude).  If you choose to, you may have sex unless your doctor tells you not to. Relieving pain and discomfort  Take breaks often, and rest with your legs raised (elevated) if you have leg cramps or low back pain.  Take warm water baths (sitz baths) to soothe pain or discomfort caused by hemorrhoids. Use hemorrhoid cream if your doctor approves.  Wear a good support bra if your breasts are tender.  If you develop bulging, swollen veins in your legs: ? Wear support hose as told by your doctor. ? Raise your feet for 15 minutes, 3-4 times a day. ? Limit salt in your food. Safety  Talk to your doctor before traveling far distances.  Do not use hot tubs, steam rooms, or saunas.  Wear your seat belt at all times when you are in a car.  Talk with your doctor if someone is hurting you or yelling at you a lot. Preparing for your baby's arrival To prepare for the arrival of your baby:  Take prenatal classes.  Visit the hospital and tour the maternity area.  Buy   a rear-facing car seat. Learn how to install it in your car.  Prepare the baby's room. Take out all pillows and stuffed animals from the baby's crib. General instructions  Avoid cat litter boxes and soil used by cats. These carry germs that can cause harm to the baby and can cause a loss of your baby by miscarriage or stillbirth.  Do not douche or use tampons. Do not use scented sanitary pads.  Do not smoke or use any products that contain nicotine or tobacco. If you need help quitting, ask your doctor.  Do not drink alcohol.  Do not use herbal medicines, illegal drugs, or medicines that were not approved by your doctor. Chemicals in these products can affect your baby.  Keep all follow-up visits. This is important. Where to find more information  American Pregnancy Association:  americanpregnancy.org  American College of Obstetricians and Gynecologists: www.acog.org  Office on Women's Health: womenshealth.gov/pregnancy Contact a doctor if:  You have a fever.  You have mild cramps or pressure in your lower belly.  You have a nagging pain in your belly area.  You vomit, or you have watery poop (diarrhea).  You have bad-smelling fluid coming from your vagina.  You have pain when you pee, or your pee smells bad.  You have a headache that does not go away when you take medicine.  You have changes in how you see, or you see spots in front of your eyes. Get help right away if:  Your water breaks.  You have regular contractions that are less than 5 minutes apart.  You are spotting or bleeding from your vagina.  You have very bad belly cramps or pain.  You have trouble breathing.  You have chest pain.  You faint.  You have not felt the baby move for the amount of time told by your doctor.  You have new or increased pain, swelling, or redness in an arm or leg. Summary  The third trimester is from week 28 through week 40 (months 7 through 9). This is the time when your unborn baby is growing very fast.  During this time, your discomfort may increase as you gain weight and as your baby grows.  Get ready for your baby to arrive by taking prenatal classes, buying a rear-facing car seat, and preparing the baby's room.  Get help right away if you are bleeding from your vagina, you have chest pain and trouble breathing, or you have not felt the baby move for the amount of time told by your doctor. This information is not intended to replace advice given to you by your health care provider. Make sure you discuss any questions you have with your health care provider. Document Revised: 06/10/2019 Document Reviewed: 04/16/2019 Elsevier Patient Education  2021 Elsevier Inc.  

## 2020-04-28 NOTE — Progress Notes (Signed)
ROB: Doing well, no concerns. 

## 2020-05-11 ENCOUNTER — Encounter: Payer: Self-pay | Admitting: Certified Nurse Midwife

## 2020-05-11 ENCOUNTER — Other Ambulatory Visit: Payer: Self-pay

## 2020-05-11 ENCOUNTER — Ambulatory Visit (INDEPENDENT_AMBULATORY_CARE_PROVIDER_SITE_OTHER): Payer: 59 | Admitting: Certified Nurse Midwife

## 2020-05-11 VITALS — BP 112/69 | HR 89 | Wt 193.6 lb

## 2020-05-11 DIAGNOSIS — Z3A33 33 weeks gestation of pregnancy: Secondary | ICD-10-CM

## 2020-05-11 DIAGNOSIS — Z3483 Encounter for supervision of other normal pregnancy, third trimester: Secondary | ICD-10-CM

## 2020-05-11 LAB — POCT URINALYSIS DIPSTICK OB
Bilirubin, UA: NEGATIVE
Blood, UA: NEGATIVE
Glucose, UA: NEGATIVE
Ketones, UA: NEGATIVE
Leukocytes, UA: NEGATIVE
Nitrite, UA: NEGATIVE
Odor: NEGATIVE
POC,PROTEIN,UA: NEGATIVE
Spec Grav, UA: <=1.005 — AB (ref 1.010–1.025)
Urobilinogen, UA: 0.2 E.U./dL
pH, UA: 8 (ref 5.0–8.0)

## 2020-05-11 NOTE — Progress Notes (Signed)
ROB- Swelling ankles and up her leg during work.

## 2020-05-11 NOTE — Patient Instructions (Signed)
Fetal Movement Counts Patient Name: ________________________________________________ Patient Due Date: ____________________  What is a fetal movement count? A fetal movement count is the number of times that you feel your baby move during a certain amount of time. This may also be called a fetal kick count. A fetal movement count is recommended for every pregnant woman. You may be asked to start counting fetal movements as early as week 28 of your pregnancy. Pay attention to when your baby is most active. You may notice your baby's sleep and wake cycles. You may also notice things that make your baby move more. You should do a fetal movement count:  When your baby is normally most active.  At the same time each day. A good time to count movements is while you are resting, after having something to eat and drink. How do I count fetal movements? 1. Find a quiet, comfortable area. Sit, or lie down on your side. 2. Write down the date, the start time and stop time, and the number of movements that you felt between those two times. Take this information with you to your health care visits. 3. Write down your start time when you feel the first movement. 4. Count kicks, flutters, swishes, rolls, and jabs. You should feel at least 10 movements. 5. You may stop counting after you have felt 10 movements, or if you have been counting for 2 hours. Write down the stop time. 6. If you do not feel 10 movements in 2 hours, contact your health care provider for further instructions. Your health care provider may want to do additional tests to assess your baby's well-being. Contact a health care provider if:  You feel fewer than 10 movements in 2 hours.  Your baby is not moving like he or she usually does. Date: ____________ Start time: ____________ Stop time: ____________ Movements: ____________ Date: ____________ Start time: ____________ Stop time: ____________ Movements: ____________ Date: ____________  Start time: ____________ Stop time: ____________ Movements: ____________ Date: ____________ Start time: ____________ Stop time: ____________ Movements: ____________ Date: ____________ Start time: ____________ Stop time: ____________ Movements: ____________ Date: ____________ Start time: ____________ Stop time: ____________ Movements: ____________ Date: ____________ Start time: ____________ Stop time: ____________ Movements: ____________ Date: ____________ Start time: ____________ Stop time: ____________ Movements: ____________ Date: ____________ Start time: ____________ Stop time: ____________ Movements: ____________ This information is not intended to replace advice given to you by your health care provider. Make sure you discuss any questions you have with your health care provider. Document Revised: 08/21/2018 Document Reviewed: 08/21/2018 Elsevier Patient Education  2021 Elsevier Inc.  

## 2020-05-11 NOTE — Progress Notes (Signed)
ROB doing well, feels good fetal movement. Discussed fetal movement kick counts, discussed warning signs PTL. Follow up 2 wk for ROB with Marcelino Duster.   Doreene Burke, CNM

## 2020-05-26 ENCOUNTER — Encounter: Payer: 59 | Admitting: Certified Nurse Midwife

## 2020-06-01 ENCOUNTER — Encounter: Payer: Self-pay | Admitting: Certified Nurse Midwife

## 2020-06-01 ENCOUNTER — Other Ambulatory Visit: Payer: Self-pay

## 2020-06-01 ENCOUNTER — Ambulatory Visit (INDEPENDENT_AMBULATORY_CARE_PROVIDER_SITE_OTHER): Payer: 59 | Admitting: Certified Nurse Midwife

## 2020-06-01 VITALS — BP 106/74 | HR 101 | Wt 194.2 lb

## 2020-06-01 DIAGNOSIS — Z3403 Encounter for supervision of normal first pregnancy, third trimester: Secondary | ICD-10-CM

## 2020-06-01 DIAGNOSIS — Z3A36 36 weeks gestation of pregnancy: Secondary | ICD-10-CM

## 2020-06-01 LAB — POCT URINALYSIS DIPSTICK OB
Bilirubin, UA: NEGATIVE
Blood, UA: NEGATIVE
Glucose, UA: NEGATIVE
Ketones, UA: NEGATIVE
Leukocytes, UA: NEGATIVE
Nitrite, UA: NEGATIVE
POC,PROTEIN,UA: NEGATIVE
Spec Grav, UA: 1.01 (ref 1.010–1.025)
Urobilinogen, UA: 0.2 E.U./dL
pH, UA: 7 (ref 5.0–8.0)

## 2020-06-01 NOTE — Patient Instructions (Signed)

## 2020-06-01 NOTE — Progress Notes (Signed)
ROB doing well. Feels good fetal movement. GBS and cultures collected today. Herbal prep handout given. SVE per pt request cervix funneling internal os difficult to reach 1-2 cm/thick/high. Follow up 1 wk with Marcelino Duster for ROB.    Doreene Burke, CNM

## 2020-06-01 NOTE — Progress Notes (Signed)
ROB: Doing well, no new concerns.

## 2020-06-02 ENCOUNTER — Other Ambulatory Visit (HOSPITAL_COMMUNITY)
Admission: RE | Admit: 2020-06-02 | Discharge: 2020-06-02 | Disposition: A | Discharge status: Home / Self Care | Payer: 59 | Source: Ambulatory Visit | Attending: Certified Nurse Midwife | Admitting: Certified Nurse Midwife

## 2020-06-02 DIAGNOSIS — Z3A36 36 weeks gestation of pregnancy: Secondary | ICD-10-CM | POA: Diagnosis present

## 2020-06-02 DIAGNOSIS — Z3403 Encounter for supervision of normal first pregnancy, third trimester: Secondary | ICD-10-CM | POA: Diagnosis not present

## 2020-06-02 LAB — OB RESULTS CONSOLE GC/CHLAMYDIA: Gonorrhea: NEGATIVE

## 2020-06-02 NOTE — Addendum Note (Signed)
Addended by: Tommie Raymond on: 06/02/2020 10:34 AM   Modules accepted: Orders

## 2020-06-03 LAB — CERVICOVAGINAL ANCILLARY ONLY
Chlamydia: NEGATIVE
Comment: NEGATIVE
Comment: NORMAL
Neisseria Gonorrhea: NEGATIVE

## 2020-06-03 LAB — STREP GP B NAA: Strep Gp B NAA: NEGATIVE

## 2020-06-10 ENCOUNTER — Other Ambulatory Visit: Payer: Self-pay

## 2020-06-10 ENCOUNTER — Ambulatory Visit (INDEPENDENT_AMBULATORY_CARE_PROVIDER_SITE_OTHER): Payer: 59 | Admitting: Certified Nurse Midwife

## 2020-06-10 ENCOUNTER — Encounter: Payer: Self-pay | Admitting: Certified Nurse Midwife

## 2020-06-10 VITALS — BP 120/83 | HR 82 | Wt 196.4 lb

## 2020-06-10 DIAGNOSIS — Z3A37 37 weeks gestation of pregnancy: Secondary | ICD-10-CM

## 2020-06-10 DIAGNOSIS — Z3403 Encounter for supervision of normal first pregnancy, third trimester: Secondary | ICD-10-CM

## 2020-06-10 LAB — POCT URINALYSIS DIPSTICK OB
Bilirubin, UA: NEGATIVE
Blood, UA: NEGATIVE
Glucose, UA: NEGATIVE
Ketones, UA: NEGATIVE
Leukocytes, UA: NEGATIVE
Nitrite, UA: NEGATIVE
POC,PROTEIN,UA: NEGATIVE
Spec Grav, UA: 1.01 (ref 1.010–1.025)
Urobilinogen, UA: 0.2 E.U./dL
pH, UA: 7 (ref 5.0–8.0)

## 2020-06-10 NOTE — Progress Notes (Signed)
ROB-Report intermittent back pain, history of back labor in last pregnancy. [employer] given. Anticipatory guidance regarding course of prenatal care. Reviewed red flag symptoms and when to call. RTC x 1 week for ROB or sooner if needed.

## 2020-06-10 NOTE — Progress Notes (Signed)
ROB: Doing well, no concerns today. 

## 2020-06-10 NOTE — Patient Instructions (Signed)
Fetal Movement Counts Patient Name: ________________________________________________ Patient Due Date: ____________________  What is a fetal movement count? A fetal movement count is the number of times that you feel your baby move during a certain amount of time. This may also be called a fetal kick count. A fetal movement count is recommended for every pregnant woman. You may be asked to start counting fetal movements as early as week 28 of your pregnancy. Pay attention to when your baby is most active. You may notice your baby's sleep and wake cycles. You may also notice things that make your baby move more. You should do a fetal movement count:  When your baby is normally most active.  At the same time each day. A good time to count movements is while you are resting, after having something to eat and drink. How do I count fetal movements? 1. Find a quiet, comfortable area. Sit, or lie down on your side. 2. Write down the date, the start time and stop time, and the number of movements that you felt between those two times. Take this information with you to your health care visits. 3. Write down your start time when you feel the first movement. 4. Count kicks, flutters, swishes, rolls, and jabs. You should feel at least 10 movements. 5. You may stop counting after you have felt 10 movements, or if you have been counting for 2 hours. Write down the stop time. 6. If you do not feel 10 movements in 2 hours, contact your health care provider for further instructions. Your health care provider may want to do additional tests to assess your baby's well-being. Contact a health care provider if:  You feel fewer than 10 movements in 2 hours.  Your baby is not moving like he or she usually does. Date: ____________ Start time: ____________ Stop time: ____________ Movements: ____________ Date: ____________ Start time: ____________ Stop time: ____________ Movements: ____________ Date: ____________  Start time: ____________ Stop time: ____________ Movements: ____________ Date: ____________ Start time: ____________ Stop time: ____________ Movements: ____________ Date: ____________ Start time: ____________ Stop time: ____________ Movements: ____________ Date: ____________ Start time: ____________ Stop time: ____________ Movements: ____________ Date: ____________ Start time: ____________ Stop time: ____________ Movements: ____________ Date: ____________ Start time: ____________ Stop time: ____________ Movements: ____________ Date: ____________ Start time: ____________ Stop time: ____________ Movements: ____________ This information is not intended to replace advice given to you by your health care provider. Make sure you discuss any questions you have with your health care provider. Document Revised: 08/21/2018 Document Reviewed: 08/21/2018 Elsevier Patient Education  2021 Elsevier Inc.  

## 2020-06-16 ENCOUNTER — Other Ambulatory Visit: Payer: Self-pay

## 2020-06-16 ENCOUNTER — Encounter: Payer: Self-pay | Admitting: Certified Nurse Midwife

## 2020-06-16 ENCOUNTER — Ambulatory Visit (INDEPENDENT_AMBULATORY_CARE_PROVIDER_SITE_OTHER): Payer: 59 | Admitting: Certified Nurse Midwife

## 2020-06-16 VITALS — BP 117/81 | HR 80 | Wt 196.2 lb

## 2020-06-16 DIAGNOSIS — Z3403 Encounter for supervision of normal first pregnancy, third trimester: Secondary | ICD-10-CM

## 2020-06-16 DIAGNOSIS — Z3A38 38 weeks gestation of pregnancy: Secondary | ICD-10-CM

## 2020-06-16 DIAGNOSIS — Z3483 Encounter for supervision of other normal pregnancy, third trimester: Secondary | ICD-10-CM

## 2020-06-16 LAB — POCT URINALYSIS DIPSTICK OB
Bilirubin, UA: NEGATIVE
Glucose, UA: NEGATIVE
Ketones, UA: NEGATIVE
Nitrite, UA: NEGATIVE
POC,PROTEIN,UA: NEGATIVE
Spec Grav, UA: 1.01 (ref 1.010–1.025)
Urobilinogen, UA: 0.2 E.U./dL
pH, UA: 7 (ref 5.0–8.0)

## 2020-06-16 NOTE — Progress Notes (Signed)
ROB-Reports increased pelvic pressure and back pain. Discussed home treatment measures. SVE per patient request. Anticipatory guidance regarding course of prenatal care. Reviewed red flag symptoms and when to call. RTC x 1 week for ROB with ANNIE or sooner if needed

## 2020-06-16 NOTE — Progress Notes (Signed)
ROB: She has been having lots of pressure and back pain.

## 2020-06-16 NOTE — Patient Instructions (Signed)
Fetal Movement Counts Patient Name: ________________________________________________ Patient Due Date: ____________________  What is a fetal movement count? A fetal movement count is the number of times that you feel your baby move during a certain amount of time. This may also be called a fetal kick count. A fetal movement count is recommended for every pregnant woman. You may be asked to start counting fetal movements as early as week 28 of your pregnancy. Pay attention to when your baby is most active. You may notice your baby's sleep and wake cycles. You may also notice things that make your baby move more. You should do a fetal movement count:  When your baby is normally most active.  At the same time each day. A good time to count movements is while you are resting, after having something to eat and drink. How do I count fetal movements? 1. Find a quiet, comfortable area. Sit, or lie down on your side. 2. Write down the date, the start time and stop time, and the number of movements that you felt between those two times. Take this information with you to your health care visits. 3. Write down your start time when you feel the first movement. 4. Count kicks, flutters, swishes, rolls, and jabs. You should feel at least 10 movements. 5. You may stop counting after you have felt 10 movements, or if you have been counting for 2 hours. Write down the stop time. 6. If you do not feel 10 movements in 2 hours, contact your health care provider for further instructions. Your health care provider may want to do additional tests to assess your baby's well-being. Contact a health care provider if:  You feel fewer than 10 movements in 2 hours.  Your baby is not moving like he or she usually does. Date: ____________ Start time: ____________ Stop time: ____________ Movements: ____________ Date: ____________ Start time: ____________ Stop time: ____________ Movements: ____________ Date: ____________  Start time: ____________ Stop time: ____________ Movements: ____________ Date: ____________ Start time: ____________ Stop time: ____________ Movements: ____________ Date: ____________ Start time: ____________ Stop time: ____________ Movements: ____________ Date: ____________ Start time: ____________ Stop time: ____________ Movements: ____________ Date: ____________ Start time: ____________ Stop time: ____________ Movements: ____________ Date: ____________ Start time: ____________ Stop time: ____________ Movements: ____________ Date: ____________ Start time: ____________ Stop time: ____________ Movements: ____________ This information is not intended to replace advice given to you by your health care provider. Make sure you discuss any questions you have with your health care provider. Document Revised: 08/21/2018 Document Reviewed: 08/21/2018 Elsevier Patient Education  2021 Elsevier Inc.  

## 2020-06-21 ENCOUNTER — Other Ambulatory Visit: Payer: Self-pay

## 2020-06-21 ENCOUNTER — Encounter: Payer: Self-pay | Admitting: Certified Nurse Midwife

## 2020-06-21 ENCOUNTER — Ambulatory Visit (INDEPENDENT_AMBULATORY_CARE_PROVIDER_SITE_OTHER): Payer: 59 | Admitting: Certified Nurse Midwife

## 2020-06-21 VITALS — BP 137/80 | HR 78 | Wt 198.7 lb

## 2020-06-21 DIAGNOSIS — Z3A38 38 weeks gestation of pregnancy: Secondary | ICD-10-CM

## 2020-06-21 DIAGNOSIS — Z3483 Encounter for supervision of other normal pregnancy, third trimester: Secondary | ICD-10-CM

## 2020-06-21 LAB — POCT URINALYSIS DIPSTICK OB
Bilirubin, UA: NEGATIVE
Blood, UA: NEGATIVE
Glucose, UA: NEGATIVE
Ketones, UA: NEGATIVE
Leukocytes, UA: NEGATIVE
Nitrite, UA: NEGATIVE
POC,PROTEIN,UA: NEGATIVE
Spec Grav, UA: <=1.005 — AB (ref 1.010–1.025)
Urobilinogen, UA: 0.2 E.U./dL
pH, UA: 6 (ref 5.0–8.0)

## 2020-06-21 NOTE — Progress Notes (Signed)
ROB doing well , feels good movement. Labor precautions reviewed. SVE per pt request. 2/60/-2. Discussed induction 41 wk if labor does not occur on its own . She verbalizes and agrees to plan.  .Return 1 wk for NST and ROB with [facility]    Doreene Burke, CNM

## 2020-06-21 NOTE — Patient Instructions (Signed)

## 2020-06-21 NOTE — Progress Notes (Signed)
OB-pt present for routine prenatal care. Pt c/o lower back pain, vaginal pressure, braxton hick contractions and mucosus discharge.

## 2020-06-28 ENCOUNTER — Inpatient Hospital Stay
Admission: EM | Admit: 2020-06-28 | Discharge: 2020-06-29 | DRG: 807 | Disposition: A | Discharge status: Home / Self Care | Payer: 59 | Attending: Certified Nurse Midwife | Admitting: Certified Nurse Midwife

## 2020-06-28 ENCOUNTER — Inpatient Hospital Stay: Payer: 59 | Admitting: Anesthesiology

## 2020-06-28 ENCOUNTER — Encounter: Payer: Self-pay | Admitting: Certified Nurse Midwife

## 2020-06-28 ENCOUNTER — Other Ambulatory Visit: Payer: Self-pay

## 2020-06-28 ENCOUNTER — Observation Stay (HOSPITAL_BASED_OUTPATIENT_CLINIC_OR_DEPARTMENT_OTHER)
Admission: EM | Admit: 2020-06-28 | Discharge: 2020-06-28 | Disposition: A | Discharge status: Home / Self Care | Payer: 59 | Source: Home / Self Care | Admitting: Obstetrics and Gynecology

## 2020-06-28 ENCOUNTER — Encounter: Payer: Self-pay | Admitting: Obstetrics and Gynecology

## 2020-06-28 ENCOUNTER — Ambulatory Visit (INDEPENDENT_AMBULATORY_CARE_PROVIDER_SITE_OTHER): Payer: 59 | Admitting: Certified Nurse Midwife

## 2020-06-28 ENCOUNTER — Other Ambulatory Visit: Payer: 59

## 2020-06-28 DIAGNOSIS — Z20822 Contact with and (suspected) exposure to covid-19: Secondary | ICD-10-CM | POA: Diagnosis present

## 2020-06-28 DIAGNOSIS — O471 False labor at or after 37 completed weeks of gestation: Secondary | ICD-10-CM | POA: Insufficient documentation

## 2020-06-28 DIAGNOSIS — Z6835 Body mass index (BMI) 35.0-35.9, adult: Secondary | ICD-10-CM | POA: Diagnosis not present

## 2020-06-28 DIAGNOSIS — Z3A39 39 weeks gestation of pregnancy: Secondary | ICD-10-CM | POA: Insufficient documentation

## 2020-06-28 DIAGNOSIS — O09893 Supervision of other high risk pregnancies, third trimester: Secondary | ICD-10-CM | POA: Insufficient documentation

## 2020-06-28 DIAGNOSIS — O99214 Obesity complicating childbirth: Principal | ICD-10-CM | POA: Diagnosis present

## 2020-06-28 DIAGNOSIS — O26893 Other specified pregnancy related conditions, third trimester: Secondary | ICD-10-CM | POA: Diagnosis present

## 2020-06-28 DIAGNOSIS — Z3483 Encounter for supervision of other normal pregnancy, third trimester: Secondary | ICD-10-CM | POA: Diagnosis not present

## 2020-06-28 LAB — POCT URINALYSIS DIPSTICK
Bilirubin, UA: NEGATIVE
Blood, UA: NEGATIVE
Glucose, UA: NEGATIVE
Ketones, UA: NEGATIVE
Leukocytes, UA: NEGATIVE
Nitrite, UA: NEGATIVE
Protein, UA: NEGATIVE
Spec Grav, UA: <=1.005 — AB (ref 1.010–1.025)
Urobilinogen, UA: 0.2 E.U./dL
pH, UA: 6.5 (ref 5.0–8.0)

## 2020-06-28 LAB — CBC
HCT: 33.5 % — ABNORMAL LOW (ref 36.0–46.0)
Hemoglobin: 10.5 g/dL — ABNORMAL LOW (ref 12.0–15.0)
MCH: 25.7 pg — ABNORMAL LOW (ref 26.0–34.0)
MCHC: 31.3 g/dL (ref 30.0–36.0)
MCV: 81.9 fL (ref 80.0–100.0)
Platelets: 247 10*3/uL (ref 150–400)
RBC: 4.09 MIL/uL (ref 3.87–5.11)
RDW: 14.9 % (ref 11.5–15.5)
WBC: 10.3 10*3/uL (ref 4.0–10.5)
nRBC: 0 % (ref 0.0–0.2)

## 2020-06-28 LAB — TYPE AND SCREEN
ABO/RH(D): O POS
Antibody Screen: NEGATIVE

## 2020-06-28 LAB — RESP PANEL BY RT-PCR (FLU A&B, COVID) ARPGX2
Influenza A by PCR: NEGATIVE
Influenza B by PCR: NEGATIVE
SARS Coronavirus 2 by RT PCR: NEGATIVE

## 2020-06-28 MED ORDER — OXYTOCIN-SODIUM CHLORIDE 30-0.9 UT/500ML-% IV SOLN
1.0000 m[IU]/min | INTRAVENOUS | Status: DC
  Administered 2020-06-28: 2 m[IU]/min via INTRAVENOUS

## 2020-06-28 MED ORDER — METHYLERGONOVINE MALEATE 0.2 MG PO TABS
0.2000 mg | ORAL_TABLET | ORAL | Status: DC | PRN
Start: 2020-06-28 — End: 2020-06-30
  Filled 2020-06-28: qty 1

## 2020-06-28 MED ORDER — ZOLPIDEM TARTRATE 5 MG PO TABS
5.0000 mg | ORAL_TABLET | Freq: Every evening | ORAL | Status: DC | PRN
  Administered 2020-06-28: 08:00:00 5 mg via ORAL
  Filled 2020-06-28: qty 1

## 2020-06-28 MED ORDER — AMMONIA AROMATIC IN INHA
RESPIRATORY_TRACT | Status: AC
  Filled 2020-06-28: qty 10

## 2020-06-28 MED ORDER — OXYTOCIN-SODIUM CHLORIDE 30-0.9 UT/500ML-% IV SOLN
2.5000 [IU]/h | INTRAVENOUS | Status: DC
  Filled 2020-06-28: qty 500

## 2020-06-28 MED ORDER — IBUPROFEN 600 MG PO TABS
600.0000 mg | ORAL_TABLET | Freq: Four times a day (QID) | ORAL | Status: DC
  Administered 2020-06-28 – 2020-06-29 (×4): 600 mg via ORAL
  Filled 2020-06-28 (×5): qty 1

## 2020-06-28 MED ORDER — PHENYLEPHRINE 40 MCG/ML (10ML) SYRINGE FOR IV PUSH (FOR BLOOD PRESSURE SUPPORT): 80.0000 ug | PREFILLED_SYRINGE | INTRAVENOUS | Status: DC | PRN

## 2020-06-28 MED ORDER — LACTATED RINGERS IV SOLN: INTRAVENOUS | Status: DC

## 2020-06-28 MED ORDER — LIDOCAINE HCL (PF) 1 % IJ SOLN
INTRAMUSCULAR | Status: DC | PRN
  Administered 2020-06-28: 3 mL

## 2020-06-28 MED ORDER — COCONUT OIL OIL
1.0000 "application " | TOPICAL_OIL | Status: DC | PRN
  Filled 2020-06-28 (×2): qty 120

## 2020-06-28 MED ORDER — BUTORPHANOL TARTRATE 1 MG/ML IJ SOLN
1.0000 mg | INTRAMUSCULAR | Status: DC | PRN
Start: 2020-06-28 — End: 2020-06-28

## 2020-06-28 MED ORDER — EPHEDRINE 5 MG/ML INJ: 10.0000 mg | INTRAVENOUS | Status: DC | PRN

## 2020-06-28 MED ORDER — ONDANSETRON HCL 4 MG PO TABS
4.0000 mg | ORAL_TABLET | ORAL | Status: DC | PRN
  Filled 2020-06-28: qty 1

## 2020-06-28 MED ORDER — OXYTOCIN 10 UNIT/ML IJ SOLN
INTRAMUSCULAR | Status: AC
  Filled 2020-06-28: qty 2

## 2020-06-28 MED ORDER — LACTATED RINGERS IV SOLN: 500.0000 mL | INTRAVENOUS | Status: DC | PRN

## 2020-06-28 MED ORDER — METHYLERGONOVINE MALEATE 0.2 MG/ML IJ SOLN: 0.2000 mg | INTRAMUSCULAR | Status: DC | PRN

## 2020-06-28 MED ORDER — LIDOCAINE-EPINEPHRINE (PF) 1.5 %-1:200000 IJ SOLN
INTRAMUSCULAR | Status: DC | PRN
  Administered 2020-06-28: 3 mL via PERINEURAL

## 2020-06-28 MED ORDER — DIPHENHYDRAMINE HCL 50 MG/ML IJ SOLN: 12.5000 mg | INTRAMUSCULAR | Status: DC | PRN

## 2020-06-28 MED ORDER — LACTATED RINGERS IV SOLN: 500.0000 mL | Freq: Once | INTRAVENOUS | Status: DC

## 2020-06-28 MED ORDER — TERBUTALINE SULFATE 1 MG/ML IJ SOLN: 0.2500 mg | Freq: Once | INTRAMUSCULAR | Status: DC | PRN

## 2020-06-28 MED ORDER — BUPIVACAINE HCL (PF) 0.25 % IJ SOLN
INTRAMUSCULAR | Status: DC | PRN
  Administered 2020-06-28: 4 mL via EPIDURAL
  Administered 2020-06-28: 3 mL via EPIDURAL

## 2020-06-28 MED ORDER — ACETAMINOPHEN 325 MG PO TABS: 650.0000 mg | ORAL_TABLET | ORAL | Status: DC | PRN

## 2020-06-28 MED ORDER — LIDOCAINE HCL (PF) 1 % IJ SOLN: 30.0000 mL | INTRAMUSCULAR | Status: DC | PRN

## 2020-06-28 MED ORDER — SODIUM CHLORIDE 0.9% FLUSH: 3.0000 mL | Freq: Two times a day (BID) | INTRAVENOUS | Status: DC

## 2020-06-28 MED ORDER — OXYTOCIN BOLUS FROM INFUSION
333.0000 mL | Freq: Once | INTRAVENOUS | Status: AC
  Administered 2020-06-28: 333 mL via INTRAVENOUS

## 2020-06-28 MED ORDER — SENNOSIDES-DOCUSATE SODIUM 8.6-50 MG PO TABS
2.0000 | ORAL_TABLET | ORAL | Status: DC
  Administered 2020-06-28 – 2020-06-29 (×2): 2 via ORAL
  Filled 2020-06-28 (×2): qty 2

## 2020-06-28 MED ORDER — WITCH HAZEL-GLYCERIN EX PADS: 1.0000 "application " | MEDICATED_PAD | CUTANEOUS | Status: DC | PRN

## 2020-06-28 MED ORDER — MISOPROSTOL 200 MCG PO TABS
ORAL_TABLET | ORAL | Status: AC
  Filled 2020-06-28: qty 4

## 2020-06-28 MED ORDER — SIMETHICONE 80 MG PO CHEW: 80.0000 mg | CHEWABLE_TABLET | ORAL | Status: DC | PRN

## 2020-06-28 MED ORDER — FENTANYL 2.5 MCG/ML W/ROPIVACAINE 0.15% IN NS 100 ML EPIDURAL (ARMC)
EPIDURAL | Status: AC
  Filled 2020-06-28: qty 100

## 2020-06-28 MED ORDER — LIDOCAINE HCL (PF) 1 % IJ SOLN
INTRAMUSCULAR | Status: AC
  Filled 2020-06-28: qty 30

## 2020-06-28 MED ORDER — ACETAMINOPHEN 500 MG PO TABS
1000.0000 mg | ORAL_TABLET | Freq: Four times a day (QID) | ORAL | Status: DC | PRN
  Administered 2020-06-28 – 2020-06-29 (×4): 1000 mg via ORAL
  Filled 2020-06-28 (×4): qty 2

## 2020-06-28 MED ORDER — DIPHENHYDRAMINE HCL 25 MG PO CAPS
25.0000 mg | ORAL_CAPSULE | Freq: Four times a day (QID) | ORAL | Status: DC | PRN
  Administered 2020-06-28: 25 mg via ORAL
  Filled 2020-06-28: qty 1

## 2020-06-28 MED ORDER — SODIUM CHLORIDE 0.9% FLUSH: 3.0000 mL | INTRAVENOUS | Status: DC | PRN

## 2020-06-28 MED ORDER — BENZOCAINE-MENTHOL 20-0.5 % EX AERO
1.0000 "application " | INHALATION_SPRAY | CUTANEOUS | Status: DC | PRN
  Administered 2020-06-28: 1 via TOPICAL
  Filled 2020-06-28: qty 56

## 2020-06-28 MED ORDER — OXYTOCIN-SODIUM CHLORIDE 30-0.9 UT/500ML-% IV SOLN
INTRAVENOUS | Status: AC
  Filled 2020-06-28: qty 500

## 2020-06-28 MED ORDER — SODIUM CHLORIDE 0.9 % IV SOLN: 250.0000 mL | INTRAVENOUS | Status: DC | PRN

## 2020-06-28 MED ORDER — FENTANYL 2.5 MCG/ML W/ROPIVACAINE 0.15% IN NS 100 ML EPIDURAL (ARMC)
12.0000 mL/h | EPIDURAL | Status: DC
  Administered 2020-06-28: 12 mL/h via EPIDURAL

## 2020-06-28 MED ORDER — DIBUCAINE (PERIANAL) 1 % EX OINT: 1.0000 "application " | TOPICAL_OINTMENT | CUTANEOUS | Status: DC | PRN

## 2020-06-28 MED ORDER — PRENATAL MULTIVITAMIN CH
1.0000 | ORAL_TABLET | Freq: Every day | ORAL | Status: DC
  Administered 2020-06-29: 1 via ORAL
  Filled 2020-06-28: qty 1

## 2020-06-28 MED ORDER — ONDANSETRON HCL 4 MG/2ML IJ SOLN: 4.0000 mg | INTRAMUSCULAR | Status: DC | PRN

## 2020-06-28 NOTE — H&P (Signed)
Obstetric History and Physical  Tracy Stevens is a 25 y.o. G2P1001 with IUP at [redacted]w[redacted]d presenting for labor management.   Patient states she has been having  regular, every five (5) to six (6) minutes contractions, none vaginal bleeding, intact membranes, with active fetal movement.    Denies difficulty breathing or respiratory distress, chest pain, dysuria, and leg pain or swelling.   Prenatal Course  Source of Care: [facility]-initial visit 10 weeks, total visits: 11  Pregnancy complications or risks: Short interval pregnancy, Obesity in pregnancy, Elevated one (1) hour glucose screening  Prenatal labs and studies:  ABO, Rh: --/--/O POS (06/14 1207)  Antibody: NEG (06/14 1207)  Rubella: 1.35 (11/23 1109)  Varicella: 300 (11/23 1109)  RPR: Non Reactive (03/28 1134)   HBsAg: Negative (11/23 1109)   HIV: Non Reactive (11/23 1109)   ALP:FXTKWIOX/-- (05/18 1458)  1 hr Glucola: 143 (03/28 1134)  Gestational Glucose Tolerance Test: 76, 160, 145, 81 (04/08 0810)  Genetic screening: Low risk female (11/23 1109)  Anatomy US: Complete, normal (03/07 1719)  Past Medical History:  Diagnosis Date   Congenital enlarged kidney    Headache     Past Surgical History:  Procedure Laterality Date   NASAL SINUS SURGERY      OB History  Gravida Para Term Preterm AB Living  2 1 1     1   SAB IAB Ectopic Multiple Live Births        0 1    # Outcome Date GA Lbr Len/2nd Weight Sex Delivery Anes PTL Lv  2 Current           1 Term 04/14/19 [redacted]w[redacted]d 18:26 / 00:28 3120 g F Vag-Spont EPI  LIV    Social History   Socioeconomic History   Marital status: Married    Spouse name: [redacted]w[redacted]d   Number of children: 0   Years of education: 12   Highest education level: High school graduate  Occupational History   Occupation: CNA  Tobacco Use   Smoking status: Never   Smokeless tobacco: Never  Vaping Use   Vaping Use: Never used  Substance and Sexual Activity   Alcohol use: No   Drug  use: Never   Sexual activity: Yes    Birth control/protection: I.U.D.  Other Topics Concern   Not on file  Social History Narrative   Not on file   Social Determinants of Health   Financial Resource Strain: Not on file  Food Insecurity: Not on file  Transportation Needs: Not on file  Physical Activity: Not on file  Stress: Not on file  Social Connections: Not on file    Family History  Problem Relation Age of Onset   Stroke Mother    Multiple sclerosis Mother    Diabetes Maternal Grandmother    Cancer Maternal Grandmother    Diabetes Maternal Grandfather    Diabetes Paternal Grandfather    Heart disease Paternal Grandfather    Asthma Sister    Epilepsy Sister     Medications Prior to Admission  Medication Sig Dispense Refill Last Dose   acetaminophen (TYLENOL) 325 MG tablet Take 650 mg by mouth every 6 (six) hours as needed.   06/27/2020   aspirin EC 81 MG tablet Take 1 tablet (81 mg total) by mouth daily. Swallow whole. Start at [redacted] wks pregnant 30 tablet 11 06/27/2020   Prenatal Vit-Fe Fumarate-FA (PRENATAL MULTIVITAMIN) TABS tablet Take 1 tablet by mouth daily at 12 noon.   06/27/2020   Lidocaine (  HM LIDOCAINE PATCH) 4 % PTCH Apply 1 patch topically 2 (two) times daily as needed. (Patient not taking: Reported on 06/28/2020) 6 patch 1 Not Taking    No Known Allergies  Review of Systems: Negative except for what is mentioned in HPI.  Physical Exam:  Temp:  [97.7 F (36.5 C)-98.7 F (37.1 C)] 98.7 F (37.1 C) (06/14 1210) Pulse Rate:  [75-102] 81 (06/14 1329) Resp:  [14-18] 16 (06/14 1210) BP: (116-127)/(70-89) 127/75 (06/14 1329) SpO2:  [100 %] 100 % (06/14 1329) Weight:  [88.4 kg-88.9 kg] 88.5 kg (06/14 1210)  GENERAL: Well-developed, well-nourished female in no acute distress.   LUNGS: Clear to auscultation bilaterally.   HEART: Regular rate and rhythm.  ABDOMEN: Soft, nontender, nondistended, gravid.  EXTREMITIES: Nontender, no edema, 2+ distal  pulses.  Cervical Exam: Dilation: 7 Effacement (%): 90 Cervical Position: Middle Station: -1 Presentation: Vertex Exam by:: M Edge RN AROM, clear fluid, moderate amount  FHT:  Category I  Contractions: Every four (4) to six (6) minutes; soft resting tone   Pertinent Labs/Studies:   Results for orders placed or performed during the hospital encounter of 06/28/20 (from the past 24 hour(s))  Type and screen     Status: None   Collection Time: 06/28/20 12:07 PM  Result Value Ref Range   ABO/RH(D) O POS    Antibody Screen NEG    Sample Expiration      07/01/2020,2359 Performed at [facility], [address] [place], [place] [address]   CBC     Status: Abnormal   Collection Time: 06/28/20 12:07 PM  Result Value Ref Range   WBC 10.3 4.0 - 10.5 K/uL   RBC 4.09 3.87 - 5.11 MIL/uL   Hemoglobin 10.5 (L) 12.0 - 15.0 g/dL   HCT 25.8 (L) 52.7 - 78.2 %   MCV 81.9 80.0 - 100.0 fL   MCH 25.7 (L) 26.0 - 34.0 pg   MCHC 31.3 30.0 - 36.0 g/dL   RDW 42.3 53.6 - 14.4 %   Platelets 247 150 - 400 K/uL   nRBC 0.0 0.0 - 0.2 %  Resp Panel by RT-PCR (Flu A&B, Covid) Nasopharyngeal Swab     Status: None   Collection Time: 06/28/20 12:07 PM   Specimen: Nasopharyngeal Swab; Nasopharyngeal(NP) swabs in vial transport medium  Result Value Ref Range   SARS Coronavirus 2 by RT PCR NEGATIVE NEGATIVE   Influenza A by PCR NEGATIVE NEGATIVE   Influenza B by PCR NEGATIVE NEGATIVE    Assessment :  Tracy Stevens is a 26 y.o. G2P1001 at [redacted]w[redacted]d being admitted for labor, Rh positive, GBS negative  FHR Category I  Plan:  Admit to birthing suites, see orders.   Induction/Augmentation as needed, per protocol.   Plan: Hopeful for vaginal birth.   Dr. Logan Bores notified of admission and plan of care.    Gunnar Bulla, CNM [facility], [facility] 06/28/20 2:29 PM

## 2020-06-28 NOTE — Anesthesia Procedure Notes (Signed)
Epidural Patient location during procedure: OB Start time: 06/28/2020 1:11 PM End time: 06/28/2020 1:30 PM  Staffing Anesthesiologist: Piscitello, Cleda Mccreedy, MD Resident/CRNA: Jeanine Luz, CRNA Performed: resident/CRNA   Preanesthetic Checklist Completed: patient identified, IV checked, site marked, risks and benefits discussed, surgical consent, monitors and equipment checked, pre-op evaluation and timeout performed  Epidural Patient position: sitting Prep: ChloraPrep Patient monitoring: heart rate, continuous pulse ox and blood pressure Approach: midline Location: L3-L4 Injection technique: LOR saline  Needle:  Needle type: Tuohy  Needle gauge: 17 G Needle length: 9 cm and 9 Needle insertion depth: 6 cm Catheter type: closed end flexible Catheter size: 19 Gauge Catheter at skin depth: 12 cm Test dose: negative and 1.5% lidocaine with Epi 1:200 K  Assessment Events: blood not aspirated, injection not painful, no injection resistance, no paresthesia and negative IV test  Additional Notes 1 attempt Pt. Evaluated and documentation done after procedure finished. Patient identified. Risks/Benefits/Options discussed with patient including but not limited to bleeding, infection, nerve damage, paralysis, failed block, incomplete pain control, headache, blood pressure changes, nausea, vomiting, reactions to medication both or allergic, itching and postpartum back pain. Confirmed with bedside nurse the patient's most recent platelet count. Confirmed with patient that they are not currently taking any anticoagulation, have any bleeding history or any family history of bleeding disorders. Patient expressed understanding and wished to proceed. All questions were answered. Sterile technique was used throughout the entire procedure. Please see nursing notes for vital signs. Test dose was given through epidural catheter and negative prior to continuing to dose epidural or start infusion.  Warning signs of high block given to the patient including shortness of breath, tingling/numbness in hands, complete motor block, or any concerning symptoms with instructions to call for help. Patient was given instructions on fall risk and not to get out of bed. All questions and concerns addressed with instructions to call with any issues or inadequate analgesia.    Patient tolerated the insertion well without immediate complications.Reason for block:procedure for pain

## 2020-06-28 NOTE — OB Triage Note (Signed)
Pt is a 24y/o G2P1 at [redacted]w[redacted]d with c/o ctx ever 3 min that began at midnight and got more intense rating 7/10. Pt states +FM. Pt denies LOF and VB. Monitors applied and assessing. Initial FHT 155.

## 2020-06-28 NOTE — Anesthesia Preprocedure Evaluation (Signed)
Anesthesia Evaluation  Patient identified by MRN, date of birth, ID band Patient awake    Reviewed: Allergy & Precautions, H&P , NPO status , Patient's Chart, lab work & pertinent test results  Airway Mallampati: II  TM Distance: >3 FB Neck ROM: full    Dental no notable dental hx.    Pulmonary neg pulmonary ROS,    Pulmonary exam normal        Cardiovascular negative cardio ROS Normal cardiovascular exam     Neuro/Psych  Headaches, negative psych ROS   GI/Hepatic negative GI ROS, Neg liver ROS,   Endo/Other  negative endocrine ROS  Renal/GU Renal disease     Musculoskeletal   Abdominal   Peds  Hematology negative hematology ROS (+)   Anesthesia Other Findings   Reproductive/Obstetrics (+) Pregnancy                             Anesthesia Physical Anesthesia Plan  ASA: 2  Anesthesia Plan: Epidural   Post-op Pain Management:    Induction:   PONV Risk Score and Plan:   Airway Management Planned:   Additional Equipment:   Intra-op Plan:   Post-operative Plan:   Informed Consent: I have reviewed the patients History and Physical, chart, labs and discussed the procedure including the risks, benefits and alternatives for the proposed anesthesia with the patient or authorized representative who has indicated his/her understanding and acceptance.       Plan Discussed with: Anesthesiologist and CRNA  Anesthesia Plan Comments:         Anesthesia Quick Evaluation

## 2020-06-28 NOTE — OB Triage Note (Signed)
Patient Discharged home per [facility]. Pt educated about labor precautions and informed when to return to the ED for further evaluation. Pt instructed to keep all follow up appointments with her provider. AVS given to patient and RN answered all questions and patient has no further questions at this time. Pt discharged home in stable condition with significant other.

## 2020-06-28 NOTE — Discharge Summary (Signed)
Obstetric Discharge Summary  Patient ID: Tracy Stevens MRN: [id number] DOB/AGE: 1995/10/31 24 y.o.   Date of Admission: 06/28/2020  Date of Discharge: 06/28/2020  Admitting Diagnosis: Observation at [redacted]w[redacted]d  Secondary Diagnosis:   Patient Active Problem List   Diagnosis Date Noted   Labor and delivery, indication for care 06/28/2020   Short interval between pregnancies affecting pregnancy, antepartum 04/28/2020   Pregnancy 04/14/2019   Urge incontinence of urine 10/30/2018   Low back pain 09/30/2018   BMI 35.0-35.9,adult 09/30/2018   Family history of breast cancer 09/30/2018   Intractable migraine with aura with status migrainosus 11/08/2015       Discharge Diagnosis: No other diagnosis   Antepartum Procedures:  NST    Brief Hospital Course   L&D OB Triage Note  Tracy Stevens is a 25 y.o. G66P1001 female at [redacted]w[redacted]d, EDD Estimated Date of Delivery: 06/29/20 who presented to triage for complaints of irregular uterine contractions.  She was evaluated by the nurses with no significant findings for maternal/fetal distress or active labor. Vital signs stable. An NST was performed and has been reviewed by CNM. She was treated with an ambien, see MAR.   NST INTERPRETATION:  Indications: rule out uterine contractions  Mode: External Baseline Rate (A): 140 bpm (fht) Variability: Moderate Accelerations: 15 x 15 Decelerations: Variable  Contraction Frequency (min): 2-5.5  Impression: reactive  Dilation: 3 Effacement (%): 60 Cervical Position: Posterior Station: -2 Presentation: Vertex Exam by:: Carlyon Shadow RN  Plan: NST performed was reviewed and was found to be reactive. She was discharged home with bleeding/labor precautions.  Continue routine prenatal care. Follow up with CNM later today as previously scheduled.   Discharge Instructions: Per After Visit Summary.  Activity: Also refer to After Visit Summary.  Diet: Regular  Medications: Allergies as of 06/28/2020    No Known Allergies      Medication List     ASK your doctor about these medications    acetaminophen 325 MG tablet Commonly known as: TYLENOL Take 650 mg by mouth every 6 (six) hours as needed.   aspirin EC 81 MG tablet Take 1 tablet (81 mg total) by mouth daily. Swallow whole. Start at [redacted] wks pregnant   Lidocaine 4 % Ptch Commonly known as: HM Lidocaine Patch Apply 1 patch topically 2 (two) times daily as needed.   prenatal multivitamin Tabs tablet Take 1 tablet by mouth daily at 12 noon.       Outpatient follow up: As previously scheduled or sooner if needed  Postpartum contraception: IUD  Discharged Condition: stable  Discharged to: home   Serafina Royals, CNM  [facility], [facility] 06/28/20 2:40 PM

## 2020-06-28 NOTE — Progress Notes (Signed)
ROB , pt was seen early this morning for labor check . Presents for scheduled appointment NST-reactive Baseline 130's accelerations present , no decels. Contractions 3-5 min. SVE per pt request 5-6cm. Pt sent to L&D for labor management. Orders placed. M. Lawhorn notified.   Doreene Burke, CNM

## 2020-06-29 LAB — CBC
HCT: 34 % — ABNORMAL LOW (ref 36.0–46.0)
Hemoglobin: 11 g/dL — ABNORMAL LOW (ref 12.0–15.0)
MCH: 26.1 pg (ref 26.0–34.0)
MCHC: 32.4 g/dL (ref 30.0–36.0)
MCV: 80.8 fL (ref 80.0–100.0)
Platelets: 227 10*3/uL (ref 150–400)
RBC: 4.21 MIL/uL (ref 3.87–5.11)
RDW: 15.1 % (ref 11.5–15.5)
WBC: 11.8 10*3/uL — ABNORMAL HIGH (ref 4.0–10.5)
nRBC: 0 % (ref 0.0–0.2)

## 2020-06-29 LAB — RPR: RPR Ser Ql: NONREACTIVE

## 2020-06-29 MED ORDER — IBUPROFEN 600 MG PO TABS: 600.0000 mg | ORAL_TABLET | Freq: Four times a day (QID) | ORAL | 0 refills | Status: AC

## 2020-06-29 NOTE — Final Progress Note (Signed)
Discharge Day SOAP Note:  Progress Note - Vaginal Delivery  Tracy Stevens is a 25 y.o. G2P2002 now PP day 1 s/p Vaginal, Spontaneous . Delivery was uncomplicated  Subjective  The patient has the following complaints: has no unusual complaints  Pain is controlled with current medications.   Patient is urinating without difficulty.  She is ambulating well.     Objective  Vital signs: BP 123/76 (BP Location: Right Arm)   Pulse 68   Temp 98 F (36.7 C) (Oral)   Resp 18   Ht 5\' 4"  (1.626 m)   Wt 88.5 kg   LMP 09/14/2019   SpO2 99% Comment: Room Air  Breastfeeding Unknown   BMI 33.47 kg/m   Physical Exam: Gen: NAD Fundus Fundal Tone: Firm  Lochia Amount: Small        Data Review Labs: Lab Results  Component Value Date   WBC 11.8 (H) 06/29/2020   HGB 11.0 (L) 06/29/2020   HCT 34.0 (L) 06/29/2020   MCV 80.8 06/29/2020   PLT 227 06/29/2020   CBC Latest Ref Rng & Units 06/29/2020 06/28/2020 04/11/2020  WBC 4.0 - 10.5 K/uL 11.8(H) 10.3 9.9  Hemoglobin 12.0 - 15.0 g/dL 11.0(L) 10.5(L) 11.0(L)  Hematocrit 36.0 - 46.0 % 34.0(L) 33.5(L) 33.5(L)  Platelets 150 - 400 K/uL 227 247 248   O POS  Edinburgh Score: Edinburgh Postnatal Depression Scale Screening Tool 06/29/2020  I have been able to laugh and see the funny side of things. (No Data)  I have looked forward with enjoyment to things. -  I have blamed myself unnecessarily when things went wrong. -  I have been anxious or worried for no good reason. -  I have felt scared or panicky for no good reason. -  Things have been getting on top of me. -  I have been so unhappy that I have had difficulty sleeping. -  I have felt sad or miserable. -  I have been so unhappy that I have been crying. -  The thought of harming myself has occurred to me. 07/01/2020 Postnatal Depression Scale Total -    Assessment/Plan  Active Problems:   Labor and delivery, indication for care   Vaginal delivery     Plan for discharge today.  Discharge Instructions: Per After Visit Summary. Activity: Advance as tolerated. Pelvic rest for 6 weeks.  Also refer to After Visit Summary Diet: Regular Medications: Allergies as of 06/29/2020   No Known Allergies      Medication List     STOP taking these medications    aspirin EC 81 MG tablet   Lidocaine 4 % Ptch Commonly known as: HM Lidocaine Patch       TAKE these medications    acetaminophen 325 MG tablet Commonly known as: TYLENOL Take 650 mg by mouth every 6 (six) hours as needed.   ibuprofen 600 MG tablet Commonly known as: ADVIL Take 1 tablet (600 mg total) by mouth every 6 (six) hours.   prenatal multivitamin Tabs tablet Take 1 tablet by mouth daily at 12 noon.       Outpatient follow up:   Follow-up Information     07/01/2020, CNM. Schedule an appointment as soon as possible for a visit in 6 week(s).   Specialties: Certified Nurse Midwife, Radiology Why: please call and make an appointment for 6 weeks for a  postpartum follow up appointment Contact information: [address] [place] [address] [phone]                Postpartum contraception: Plans IUD  Discharged Condition: good  Discharged to: home  Newborn Data: Disposition:home with mother  Apgars: APGAR (1 MIN): 6   APGAR (5 MINS): 8   APGAR (10 MINS):    Baby Feeding: Breast   Doreene Burke, CNM  06/29/2020 5:51 PM

## 2020-06-29 NOTE — Lactation Note (Signed)
This note was copied from a baby's chart. Lactation Consultation Note  Patient Name: Tracy Stevens [id number] Date: 06/29/2020 Reason for consult: Follow-up assessment;1st time breastfeeding;Term Age:26 hours  Lactation follow-up. Baby active at breast when [facility] entered. Mom independent at getting baby to the breast in football hold. LC praised for good position and latch, audible swallows heard while speaking with mom.  Reviewed with mom anticipated breastfeeding expectations in the days/weeks to come. Reviewed early cues, positions, latch/alignment, signs of transfer, output expectations, growth spurts and cluster feeding. Educated on milk supply and demand and normal course of lactation.  Information given for anticipated breast changes and management of breast fullness and engorgement. Reviewed with mom alternating use of coconut oil and comfort gels for nipple tenderness.   Information given for outpatient lactation services and community breastfeeding resources.  Encouraged to call with questions, support, or appointment.  Maternal Data Has patient been taught Hand Expression?: Yes Does the patient have breastfeeding experience prior to this delivery?: No  Feeding Mother's Current Feeding Choice: Breast Milk  LATCH Score Latch: Grasps breast easily, tongue down, lips flanged, rhythmical sucking.  Audible Swallowing: Spontaneous and intermittent  Type of Nipple: Everted at rest and after stimulation  Comfort (Breast/Nipple): Filling, red/small blisters or bruises, mild/mod discomfort  Hold (Positioning): No assistance needed to correctly position infant at breast.  LATCH Score: 9   Lactation Tools Discussed/Used    Interventions Interventions: Breast feeding basics reviewed;Education;Hand express;Skin to skin  Discharge Discharge Education: Engorgement and breast care;Warning signs for feeding baby;Outpatient recommendation  Consult Status Consult Status:  Complete Date: 06/29/20 Follow-up type: Call as needed    Danford Bad 06/29/2020, 4:37 PM

## 2020-06-29 NOTE — Anesthesia Postprocedure Evaluation (Signed)
Anesthesia Post Note  Patient: Tracy Stevens  Procedure(s) Performed: AN AD HOC LABOR EPIDURAL  Patient location during evaluation: Mother Baby Anesthesia Type: Epidural Level of consciousness: awake and alert Pain management: pain level controlled Vital Signs Assessment: post-procedure vital signs reviewed and stable Respiratory status: spontaneous breathing, nonlabored ventilation and respiratory function stable Cardiovascular status: stable Postop Assessment: no headache, no backache and epidural receding Anesthetic complications: no   No notable events documented.   Last Vitals:  Vitals:   06/29/20 0311 06/29/20 0809  BP: 120/84 127/82  Pulse: 74 63  Resp: 18 20  Temp: 36.6 C 36.6 C  SpO2: 99% 99%    Last Pain:  Vitals:   06/29/20 0809  TempSrc: Oral  PainSc:                  Rica Mast

## 2020-06-29 NOTE — Discharge Summary (Signed)
Discharge instructions given and reviewed with pt and family. Pt educated on follow up care, appointments and when to notify provider, questions invited and answered. ID bands matched with infant.  Pt and family noted understanding  to teaching/education.Pt escorted off unit by volunteer in wheelchair with infant safely in arms. Infant secured in car seat by family. 

## 2020-06-29 NOTE — Lactation Note (Signed)
This note was copied from a baby's chart. Lactation Consultation Note  Patient Name: Tracy Stevens [id number] Date: 06/29/2020 Reason for consult: Mother's request;Term;1st time breastfeeding;Initial assessment Age:26 hours  Initial lactation visit. Mom is G2P2, SVD 22hrs ago. This is her first time breastfeeding- did not breastfeed her 68month old.  Baby has gone to the breast frequently, some short feedings and some long, mom unsure that she is getting enough and mom is become sore/tender.  LC reviewed with mom positioning and alignment, flanged top/bottom lips, nose to nipple, and wide open mouth. LC assisted with latching baby in modified cross-cradle hold. Baby did come on/off breast at first, but with breast compressions and massage baby sustained latch for 10 minutes with audible swallows.  LC encouraged mom to do the same with each feeding to aid in milk transfer being easier for baby and to keep her more alert. LC reviewed hand expression, mom able to independently express, and states she has been leaking for "a while".   Discussed what to expect with breastfeeding in the days/week to come, milk supply and demand, growth spurts and cluster feeding, use of EBM/coconut oil/comfort gels to aid in nipple tenderness relief, output expectations and other signs of adequate intake, and outpatient lactation services. LC educated mom on anticipated breast changes, breast fullness and engorgement and management of both, nipple care, and pump use and introduction timing; encouraged the delay of bottles/nipples until at least 4 weeks and explained the impact this may have on breast milk supply.  Encouraged to call for ongoing support as needed, contact information given, family anticipating 24hr discharge.  Maternal Data Has patient been taught Hand Expression?: Yes Does the patient have breastfeeding experience prior to this delivery?: No (did not BF first child)  Feeding Mother's Current  Feeding Choice: Breast Milk  LATCH Score Latch: Grasps breast easily, tongue down, lips flanged, rhythmical sucking.  Audible Swallowing: Spontaneous and intermittent  Type of Nipple: Everted at rest and after stimulation  Comfort (Breast/Nipple): Filling, red/small blisters or bruises, mild/mod discomfort (some tenderness)  Hold (Positioning): Assistance needed to correctly position infant at breast and maintain latch.  LATCH Score: 8   Lactation Tools Discussed/Used    Interventions Interventions: Breast feeding basics reviewed;Assisted with latch;Skin to skin;Hand express;Support pillows;Position options;Education  Discharge    Consult Status Consult Status: Follow-up Date: 06/29/20 Follow-up type: Call as needed    Danford Bad 06/29/2020, 2:30 PM

## 2020-06-29 NOTE — Discharge Summary (Signed)
Patient Name: Tracy Stevens DOB: 06/15/1995 MRN: [id number]                            Discharge Summary  Date of Admission: 06/28/2020 Date of Discharge: 06/29/2020 Delivering Provider: Gunnar Bulla   Admitting Diagnosis: Labor and delivery, indication for care [O75.9] at [redacted]w[redacted]d Secondary diagnosis:  Active Problems:   Labor and delivery, indication for care   Vaginal delivery   Mode of Delivery: normal spontaneous vaginal delivery              Discharge diagnosis: Term Pregnancy Delivered      Intrapartum Procedures: epidural and laceration bilateral labial laceration   Post partum procedures:  none  Complications: none and Bilateral labial laceration                     Discharge Day SOAP Note:  Progress Note - Vaginal Delivery  Tracy Stevens is a 25 y.o. G2P2002 now PP day 1 s/p Vaginal, Spontaneous . Delivery was uncomplicated  Subjective  The patient has the following complaints: has no unusual complaints  Pain is controlled with current medications.   Patient is urinating without difficulty.  She is ambulating well.     Objective  Vital signs: BP 123/76 (BP Location: Right Arm)   Pulse 68   Temp 98 F (36.7 C) (Oral)   Resp 18   Ht 5\' 4"  (1.626 m)   Wt 88.5 kg   LMP 09/14/2019   SpO2 99% Comment: Room Air  Breastfeeding Unknown   BMI 33.47 kg/m   Physical Exam: Gen: NAD Fundus Fundal Tone: Firm  Lochia Amount: Small        Data Review Labs: Lab Results  Component Value Date   WBC 11.8 (H) 06/29/2020   HGB 11.0 (L) 06/29/2020   HCT 34.0 (L) 06/29/2020   MCV 80.8 06/29/2020   PLT 227 06/29/2020   CBC Latest Ref Rng & Units 06/29/2020 06/28/2020 04/11/2020  WBC 4.0 - 10.5 K/uL 11.8(H) 10.3 9.9  Hemoglobin 12.0 - 15.0 g/dL 11.0(L) 10.5(L) 11.0(L)  Hematocrit 36.0 - 46.0 % 34.0(L) 33.5(L) 33.5(L)  Platelets 150 - 400 K/uL 227 247 248   O POS  Edinburgh Score: Edinburgh Postnatal Depression Scale Screening Tool  06/29/2020  I have been able to laugh and see the funny side of things. (No Data)  I have looked forward with enjoyment to things. -  I have blamed myself unnecessarily when things went wrong. -  I have been anxious or worried for no good reason. -  I have felt scared or panicky for no good reason. -  Things have been getting on top of me. -  I have been so unhappy that I have had difficulty sleeping. -  I have felt sad or miserable. -  I have been so unhappy that I have been crying. -  The thought of harming myself has occurred to me. 07/01/2020 Postnatal Depression Scale Total -    Assessment/Plan  Active Problems:   Labor and delivery, indication for care   Vaginal delivery    Plan for discharge today.  Discharge Instructions: Per After Visit Summary. Activity: Advance as tolerated. Pelvic rest for 6 weeks.  Also refer to After Visit Summary Diet: Regular Medications: Allergies as of 06/29/2020   No Known Allergies      Medication List     STOP taking these  medications    aspirin EC 81 MG tablet   Lidocaine 4 % Ptch Commonly known as: HM Lidocaine Patch       TAKE these medications    acetaminophen 325 MG tablet Commonly known as: TYLENOL Take 650 mg by mouth every 6 (six) hours as needed.   ibuprofen 600 MG tablet Commonly known as: ADVIL Take 1 tablet (600 mg total) by mouth every 6 (six) hours.   prenatal multivitamin Tabs tablet Take 1 tablet by mouth daily at 12 noon.       Outpatient follow up:   Follow-up Information     Doreene Burke, CNM. Schedule an appointment as soon as possible for a visit in 6 week(s).   Specialties: Certified Nurse Midwife, Radiology Why: please call and make an appointment for 6 weeks for a postpartum follow up appointment Contact information: [address] [place] [address] [phone]                Postpartum contraception: Plans IUD  Discharged Condition: good  Discharged  to: home  Newborn Data: Disposition:home with mother  Apgars: APGAR (1 MIN): 6   APGAR (5 MINS): 8   APGAR (10 MINS):    Baby Feeding: Breast   Doreene Burke, CNM  06/29/2020 5:51 PM

## 2020-07-15 ENCOUNTER — Ambulatory Visit (INDEPENDENT_AMBULATORY_CARE_PROVIDER_SITE_OTHER): Payer: 59 | Admitting: Certified Nurse Midwife

## 2020-07-15 ENCOUNTER — Encounter: Payer: Self-pay | Admitting: Certified Nurse Midwife

## 2020-07-15 ENCOUNTER — Other Ambulatory Visit: Payer: Self-pay

## 2020-07-15 DIAGNOSIS — Z1331 Encounter for screening for depression: Secondary | ICD-10-CM

## 2020-07-15 MED ORDER — SERTRALINE HCL 25 MG PO TABS: 25.0000 mg | ORAL_TABLET | Freq: Every day | ORAL | 1 refills | Status: DC

## 2020-07-15 NOTE — Progress Notes (Signed)
Virtual Visit via Telephone Note  I connected with Tracy Stevens on 07/15/20 at 11:30 AM EDT by telephone and verified that I am speaking with the correct person using two identifiers.  Location:  Patient: Tracy Stevens (home)  Provider: Serafina Royals, CNM ([facility], [facility])   I discussed the limitations, risks, security and privacy concerns of performing an evaluation and management service by telephone and the availability of in person appointments. I also discussed with the patient that there may be a patient responsible charge related to this service. The patient expressed understanding and agreed to proceed.   History of Present Illness:  Patient is two (2) weeks postpartum spontaneous vaginal birth.   Breastfeeding with difficulty, reports sore breasts.   Vaginal bleeding moderate at times, correlates with level of activity.   Husband returned to work last week. Home with both girls during the day. Limited sleep and family assistance.   Generalized SI without active plan, wants to be "around for her children".    Observations/Objective:  Depression screen [facility] 2/9 07/15/2020 04/28/2020 03/17/2020 05/26/2019 04/28/2019  Decreased Interest 2 0 0 0 0  Down, Depressed, Hopeless 3 0 0 1 1  PHQ - 2 Score 5 0 0 1 1  Altered sleeping 2 - - 0 0  Tired, decreased energy 3 - - 0 0  Change in appetite 2 - - 1 1  Feeling bad or failure about yourself  2 - - 1 1  Trouble concentrating 0 - - 0 3  Moving slowly or fidgety/restless 1 - - 0 1  Suicidal thoughts 1 - - 0 1  PHQ-9 Score 16 - - 3 8  Difficult doing work/chores Very difficult - - Not difficult at all Somewhat difficult    GAD 7 : Generalized Anxiety Score 07/15/2020  Nervous, Anxious, on Edge 2  Control/stop worrying 2  Worry too much - different things 2  Trouble relaxing 2  Restless 0  Easily annoyed or irritable 2  Afraid - awful might happen 0  Total GAD 7 Score 10  Anxiety Difficulty Somewhat  difficult   Assessment:  1. Postpartum care and examination   2. Lactating mother   3. Positive depression screening   Plan:  Emotional support provided.   Routine postpartum care and education.   Encouraged rest, baby wearing, and pumping/formula supplementation.  Rx Zoloft, see orders.   Reviewed red flag symptoms and when to call.   RTC x 2 weeks for TELEVISIT mood check or sooner if needed.  Follow Up Instructions:    I discussed the assessment and treatment plan with the patient. The patient was provided an opportunity to ask questions and all were answered. The patient agreed with the plan and demonstrated an understanding of the instructions.   The patient was advised to call back or seek an in-person evaluation if the symptoms worsen or if the condition fails to improve as anticipated.  I provided 20 minutes of non-face-to-face time during this encounter.   Serafina Royals, CNM [facility], [facility] 07/15/20 11:47 AM

## 2020-07-15 NOTE — Patient Instructions (Signed)
Sertraline Tablets What is this medication? SERTRALINE (SER tra leen) treats depression, anxiety, obsessive-compulsive disorder (OCD), post-traumatic stress disorder (PTSD), and premenstrual dysphoric disorder (PMDD). It increases the amount of serotonin in the brain, a hormone that helps regulate mood. It belongs to a group of medications called SSRIs. This medicine may be used for other purposes; ask your health care provider or pharmacist if you have questions. COMMON BRAND NAME(S): Zoloft What should I tell my care team before I take this medication? They need to know if you have any of these conditions: Bleeding disorders Bipolar disorder or a family history of bipolar disorder Glaucoma Heart disease High blood pressure History of irregular heartbeat History of low levels of calcium, magnesium, or potassium in the blood If you often drink alcohol Liver disease Receiving electroconvulsive therapy Seizures Suicidal thoughts, plans, or attempt; a previous suicide attempt by you or a family member Take medications that treat or prevent blood clots Thyroid disease An unusual or allergic reaction to sertraline, other medications, foods, dyes, or preservatives Pregnant or trying to get pregnant Breast-feeding How should I use this medication? Take this medication by mouth with a glass of water. Follow the directions on the prescription label. You can take it with or without food. Take your medication at regular intervals. Do not take your medication more often than directed. Do not stop taking this medication suddenly except upon the advice of your care team. Stopping this medication too quickly may cause serious side effects or your condition may worsen. A special MedGuide will be given to you by the pharmacist with each prescription and refill. Be sure to read this information carefully each time. Talk to your care team about the use of this medication in children. While this medication  may be prescribed for children as young as 7 years for selected conditions, precautions do apply. Overdosage: If you think you have taken too much of this medicine contact a poison control center or emergency room at once. NOTE: This medicine is only for you. Do not share this medicine with others. What if I miss a dose? If you miss a dose, take it as soon as you can. If it is almost time for your next dose, take only that dose. Do not take double or extra doses. What may interact with this medication? Do not take this medication with any of the following: Cisapride Dronedarone Linezolid MAOIs like Carbex, Eldepryl, Marplan, Nardil, and Parnate Methylene blue (injected into a vein) Pimozide Thioridazine This medication may also interact with the following: Alcohol Amphetamines Aspirin and aspirin-like medications Certain medications for depression, anxiety, or psychotic disturbances Certain medications for fungal infections like ketoconazole, fluconazole, posaconazole, and itraconazole Certain medications for irregular heart beat like flecainide, quinidine, propafenone Certain medications for migraine headaches like almotriptan, eletriptan, frovatriptan, naratriptan, rizatriptan, sumatriptan, zolmitriptan Certain medications for sleep Certain medications for seizures like carbamazepine, valproic acid, phenytoin Certain medications that treat or prevent blood clots like warfarin, enoxaparin, dalteparin Cimetidine Digoxin Diuretics Fentanyl Isoniazid Lithium NSAIDs, medications for pain and inflammation, like ibuprofen or naproxen Other medications that prolong the QT interval (cause an abnormal heart rhythm) like dofetilide Rasagiline Safinamide Supplements like St. John's wort, kava kava, valerian Tolbutamide Tramadol Tryptophan This list may not describe all possible interactions. Give your health care provider a list of all the medicines, herbs, non-prescription drugs, or  dietary supplements you use. Also tell them if you smoke, drink alcohol, or use illegal drugs. Some items may interact with your medicine. What should   I watch for while using this medication? Tell your health care provider if your symptoms do not get better or if they get worse. Visit your health care provider for regular checks on your progress. Because it may take several weeks to see the full effects of this medication, it is important to continue your treatment as prescribed by your health care provider. Patients and their families should watch out for new or worsening thoughts of suicide or depression. Also watch out for sudden changes in feelings such as feeling anxious, agitated, panicky, irritable, hostile, aggressive, impulsive, severely restless, overly excited and hyperactive, or not being able to sleep. If this happens, especially at the beginning of treatment or after a change in dose, call your health care provider. You may get drowsy or dizzy. Do not drive, use machinery, or do anything that needs mental alertness until you know how this medication affects you. Do not stand or sit up quickly, especially if you are an older patient. This reduces the risk of dizzy or fainting spells. Alcohol may interfere with the effect of this medication. Your mouth may get dry. Chewing sugarless gum or sucking hard candy, and drinking plenty of water may help. Contact your health care provider if the problem does not go away or is severe. What side effects may I notice from receiving this medication? Side effects that you should report to your care team as soon as possible: Allergic reactions-skin rash, itching, hives, swelling of the face, lips, tongue, or throat Bleeding-bloody or black, tar-like stools, red or dark brown urine, vomiting blood or brown material that looks like coffee grounds, small red or purple spots on skin, unusual bleeding or bruising Heart rhythm changes-fast or irregular heartbeat,  dizziness, feeling faint or lightheaded, chest pain, trouble breathing Low sodium level-muscle weakness, fatigue, dizziness, headache, confusion Serotonin syndrome-irritability, confusion, fast or irregular heartbeat, muscle stiffness, twitching muscles, sweating, high fever, seizure, chills, vomiting, diarrhea Sudden eye pain or change in vision such as blurred vision, seeing halos around lights, vision loss Thoughts of suicide or self-harm, worsening mood Side effects that usually do not require medical attention (report these to your care team if they continue or are bothersome): Change in sex drive or performance Diarrhea Excessive sweating Nausea Tremors or shaking Upset stomach This list may not describe all possible side effects. Call your doctor for medical advice about side effects. You may report side effects to FDA at 1-800-FDA-1088. Where should I keep my medication? Keep out of the reach of children and pets. Store at room temperature between 15 and 30 degrees C (59 and 86 degrees F). Get rid of any unused medication after the expiration date. To get rid of medications that are no longer needed or expired: Take the medication to a medication take-back program. Check with your pharmacy or law enforcement to find a location. If you cannot return the medication, check the label or package insert to see if the medication should be thrown out in the garbage or flushed down the toilet. If you are not sure, ask your care team. If it is safe to put in the trash, empty the medication out of the container. Mix the medication with cat litter, dirt, coffee grounds, or other unwanted substance. Seal the mixture in a bag or container. Put it in the trash. NOTE: This sheet is a summary. It may not cover all possible information. If you have questions about this medicine, talk to your doctor, pharmacist, or health care provider.  2022   Elsevier/Gold Standard (2020-01-29 12:29:28)  

## 2020-07-29 ENCOUNTER — Ambulatory Visit (INDEPENDENT_AMBULATORY_CARE_PROVIDER_SITE_OTHER): Payer: 59 | Admitting: Certified Nurse Midwife

## 2020-07-29 ENCOUNTER — Encounter: Payer: Self-pay | Admitting: Certified Nurse Midwife

## 2020-07-29 DIAGNOSIS — Z79899 Other long term (current) drug therapy: Secondary | ICD-10-CM | POA: Diagnosis not present

## 2020-07-29 DIAGNOSIS — F32A Depression, unspecified: Secondary | ICD-10-CM | POA: Diagnosis not present

## 2020-07-29 DIAGNOSIS — Z1331 Encounter for screening for depression: Secondary | ICD-10-CM

## 2020-07-29 NOTE — Progress Notes (Signed)
Virtual Visit via Telephone Note  I connected with Brayah Urquilla Carlberg on 07/29/20 at 11:45 AM EDT by telephone and verified that I am speaking with the correct person using two identifiers.  Location:  Patient: Tracy Stevens (home)  Provider: Serafina Royals, CNM ([facility], [facility])   I discussed the limitations, risks, security and privacy concerns of performing an evaluation and management service by telephone and the availability of in person appointments. I also discussed with the patient that there may be a patient responsible charge related to this service. The patient expressed understanding and agreed to proceed.   History of Present Illness:  Patient telephoned today for medication management and mood check.   Taking Zoloft 25 mg daily. No longer breastfeeding or pumping; formula feeding only.   Feeling better formula feeding and husband is able to assist when home.   Daughters are recovering for RSV.   Denies SI/HI.    Observations/Objective:  Depression screen [facility] 2/9 07/29/2020 07/15/2020 04/28/2020 03/17/2020 05/26/2019  Decreased Interest 1 2 0 0 0  Down, Depressed, Hopeless 0 3 0 0 1  PHQ - 2 Score 1 5 0 0 1  Altered sleeping 1 2 - - 0  Tired, decreased energy 1 3 - - 0  Change in appetite 0 2 - - 1  Feeling bad or failure about yourself  1 2 - - 1  Trouble concentrating 0 0 - - 0  Moving slowly or fidgety/restless 0 1 - - 0  Suicidal thoughts 0 1 - - 0  PHQ-9 Score 4 16 - - 3  Difficult doing work/chores Somewhat difficult Very difficult - - Not difficult at all   GAD 7 : Generalized Anxiety Score 07/29/2020 07/15/2020  Nervous, Anxious, on Edge 1 2  Control/stop worrying 0 2  Worry too much - different things 1 2  Trouble relaxing 2 2  Restless 1 0  Easily annoyed or irritable 2 2  Afraid - awful might happen 0 0  Total GAD 7 Score 7 10  Anxiety Difficulty Somewhat difficult Somewhat difficult    Assessment:  1. Medication  management   2. Positive depression screening   3. Postpartum care and examination   Plan:  Continue medication as prescribed.   Reviewed red flag symptoms and when to call.   RTC for PPV as scheduled on 08/15/2020 at 11 am.   Follow Up Instructions:    I discussed the assessment and treatment plan with the patient. The patient was provided an opportunity to ask questions and all were answered. The patient agreed with the plan and demonstrated an understanding of the instructions.   The patient was advised to call back or seek an in-person evaluation if the symptoms worsen or if the condition fails to improve as anticipated.  I provided 6 minutes of non-face-to-face time during this encounter.   Serafina Royals, CNM [facility], [facility] 07/29/20 12:09 PM

## 2020-08-05 ENCOUNTER — Telehealth: Payer: Self-pay | Admitting: Certified Nurse Midwife

## 2020-08-05 NOTE — Telephone Encounter (Signed)
Spoke with patient in regards to MyChart message of heavy bleeding. Patient stated they are not soaking a pad every 30 minutes - 1 hour but they are soaking a pad about every 2 hours and having to stay near the toilet. Informed patient that no providers are in office today but advised patient to continue monitoring and that she may go to ED or urgent care if she feels necessary and to call back with any other questions or concerns. or to schedule appointment if problem persists. No further questions voiced from patient.

## 2020-08-05 NOTE — Telephone Encounter (Signed)
Patient called and stated that 2 days ago she started bleeding and thought that maybe it was her period.  She states that she is bleeding heavy.  She cant take a shower with out it running down her leg.  She completely saturates a pad in 2 hrs and that's a thick pad.  She is seeing any big clots but does see little clots. Patient denies cramping.

## 2020-08-15 ENCOUNTER — Ambulatory Visit (INDEPENDENT_AMBULATORY_CARE_PROVIDER_SITE_OTHER): Payer: 59 | Admitting: Certified Nurse Midwife

## 2020-08-15 ENCOUNTER — Other Ambulatory Visit: Payer: Self-pay

## 2020-08-15 ENCOUNTER — Encounter: Payer: Self-pay | Admitting: Certified Nurse Midwife

## 2020-08-15 MED ORDER — SERTRALINE HCL 25 MG PO TABS: 25.0000 mg | ORAL_TABLET | Freq: Every day | ORAL | 6 refills | Status: DC

## 2020-08-15 NOTE — Progress Notes (Signed)
Subjective:    Tracy Stevens is a 25 y.o. [id number] Caucasian female who presents for a postpartum visit. She is 6 weeks postpartum following a spontaneous vaginal delivery at 39.6 gestational weeks. Anesthesia: epidural. I have fully reviewed the prenatal and intrapartum course. Postpartum course has been normal. Baby's course has been normal. Baby is feeding by  bottle . Bleeding no bleeding. Bowel function is normal. Bladder function is normal. Patient is not sexually active.. Contraception method is IUD. Will follow up for placement. Postpartum depression screening: negative. Score 2.  Last pap 09/13/2017 and was negative.  The following portions of the patient's history were reviewed and updated as appropriate: allergies, current medications, past medical history, past surgical history and problem list.  Review of Systems Pertinent items are noted in HPI.   Vitals:   08/15/20 1109  BP: 135/86  Pulse: 82  Weight: 177 lb (80.3 kg)  Height: 5\' 4"  (1.626 m)   Patient's last menstrual period was 09/14/2019.  Objective:   General:  alert, cooperative and no distress   Breasts:  deferred, no complaints  Lungs: clear to auscultation bilaterally  Heart:  regular rate and rhythm  Abdomen: soft, nontender   Vulva: normal  Vagina: normal vagina  Cervix:  closed  Corpus: Well-involuted  Adnexa:  Non-palpable  Rectal Exam: no hemorrhoids        Assessment:   Postpartum exam 6 wks s/p SVD Bottle feeding Depression screening Contraception counseling   Plan:  : IUD, will follow up for placement  Follow up in: 1 week for IUD or earlier if needed. Orders placed for refill zoloft.   09/16/2019 CNM

## 2020-08-15 NOTE — Patient Instructions (Signed)

## 2020-10-17 ENCOUNTER — Other Ambulatory Visit: Payer: Self-pay

## 2020-10-17 ENCOUNTER — Encounter: Payer: Self-pay | Admitting: Certified Nurse Midwife

## 2020-10-17 ENCOUNTER — Ambulatory Visit: Payer: 59 | Admitting: Certified Nurse Midwife

## 2020-10-17 VITALS — BP 136/80 | HR 92 | Ht 63.0 in | Wt 180.7 lb

## 2020-10-17 DIAGNOSIS — Z30430 Encounter for insertion of intrauterine contraceptive device: Secondary | ICD-10-CM | POA: Diagnosis not present

## 2020-10-17 LAB — POCT URINE PREGNANCY: Preg Test, Ur: NEGATIVE

## 2020-10-17 NOTE — Patient Instructions (Signed)

## 2020-10-17 NOTE — Progress Notes (Signed)
   GYNECOLOGY OFFICE PROCEDURE NOTE  Tracy Stevens is a 25 y.o. [id number] here for Mirena IUD insertion. No GYN concerns.  Last pap smear was on 09/13/2017 and was normal.  IUD Insertion Procedure Note Patient identified, informed consent performed, consent signed.   Discussed risks of irregular bleeding, cramping, infection, malpositioning or misplacement of the IUD outside the uterus which may require further procedure such as laparoscopy. Also discussed >99% contraception efficacy, increased risk of ectopic pregnancy with failure of method.   Emphasized that this did not protect against STIs, condoms recommended during all sexual encounters. Time out was performed.  Urine pregnancy test negative.  Speculum placed in the vagina.  Cervix visualized.  Cleaned with Betadine x 2.  Grasped anteriorly with a single tooth tenaculum.  Uterus sounded to 8 cm.  mirena IUD placed per manufacturer's recommendations.  Strings trimmed to 3 cm. Tenaculum was removed, good hemostasis noted.  Patient tolerated procedure well.   Patient was given post-procedure instructions.  She was advised to have backup contraception for one week.  Patient was also asked to check IUD strings periodically and follow up in 4 weeks for IUD check.   Doreene Burke, CNM

## 2020-11-14 ENCOUNTER — Ambulatory Visit: Payer: 59 | Admitting: Certified Nurse Midwife

## 2020-11-14 ENCOUNTER — Encounter: Payer: Self-pay | Admitting: Certified Nurse Midwife

## 2020-11-14 ENCOUNTER — Other Ambulatory Visit: Payer: Self-pay

## 2020-11-14 VITALS — BP 122/78 | HR 76 | Ht 64.0 in | Wt 184.9 lb

## 2020-11-14 DIAGNOSIS — Z30431 Encounter for routine checking of intrauterine contraceptive device: Secondary | ICD-10-CM | POA: Diagnosis not present

## 2020-11-14 NOTE — Patient Instructions (Signed)

## 2020-11-14 NOTE — Progress Notes (Signed)
   GYNECOLOGY OFFICE ENCOUNTER NOTE  History:  25 y.o. [id number] here today for today for IUD string check; Mirena  IUD was placed  10/17/20. No complaints about the IUD, no concerning side effects. She state she sometimes has a pinching feeling.   The following portions of the patient's history were reviewed and updated as appropriate: allergies, current medications, past family history, past medical history, past social history, past surgical history and problem list. Last pap smear on 09/13/2017 was normal, negative HRHPV.  Review of Systems:  Pertinent items are noted in HPI.   Objective:  Physical Exam not currently breastfeeding. CONSTITUTIONAL: Well-developed, well-nourished female in no acute distress.  NEUROLOGIC: Alert and oriented to person, place, and time. Normal reflexes, muscle tone coordination.  PSYCHIATRIC: Normal mood and affect. Normal behavior. Normal judgment and thought content. CARDIOVASCULAR: Normal heart rate noted RESPIRATORY: Effort and breath sounds normal, no problems with respiration noted ABDOMEN: Soft, no distention noted.   PELVIC: Normal appearing external genitalia; normal appearing vaginal mucosa and cervix.  IUD strings visualized, about 3 cm in length outside cervix. Done in the presence of a chaperone.   Assessment & Plan:  Patient to keep IUD in place for up to 7 years; can come in for removal earlier if she desires or for any concerning side effects. Recommended condoms for STI prevention. Discussed u/s if needed. She states she will wait a bit longer and if it continues to happen she will let me know and I can order u/s for placement.    Doreene Burke, CNM

## 2020-11-23 ENCOUNTER — Other Ambulatory Visit: Payer: Self-pay

## 2020-11-23 MED ORDER — SERTRALINE HCL 25 MG PO TABS: 25.0000 mg | ORAL_TABLET | Freq: Every day | ORAL | 6 refills | Status: AC

## 2021-03-22 ENCOUNTER — Telehealth: Payer: 59 | Admitting: Physician Assistant

## 2021-03-22 DIAGNOSIS — R52 Pain, unspecified: Secondary | ICD-10-CM

## 2021-03-22 MED ORDER — BENZONATATE 100 MG PO CAPS: 100.0000 mg | ORAL_CAPSULE | Freq: Three times a day (TID) | ORAL | 0 refills | Status: AC

## 2021-03-22 MED ORDER — FLUTICASONE PROPIONATE 50 MCG/ACT NA SUSP: 2.0000 | Freq: Every day | NASAL | 0 refills | Status: AC

## 2021-03-22 NOTE — Progress Notes (Signed)
E-Visit for Upper Respiratory Infection  ? ?We are sorry you are not feeling well.  Here is how we plan to help! ? ?Based on what you have shared with me, it looks like you may have a viral upper respiratory infection.  Upper respiratory infections are caused by a large number of viruses; however, rhinovirus is the most common cause. However, your symptoms could also be caused by the COVID virus and you will need to test for this to ensure you do not have it before it is safe for you to return to your daily activities. ? ?Symptoms vary from person to person, with common symptoms including sore throat, cough, fatigue or lack of energy and feeling of general discomfort.  A low-grade fever of up to 100.4 may present, but is often uncommon.  Symptoms vary however, and are closely related to a person's age or underlying illnesses.  The most common symptoms associated with an upper respiratory infection are nasal discharge or congestion, cough, sneezing, headache and pressure in the ears and face.  These symptoms usually persist for about 3 to 10 days, but can last up to 2 weeks.  It is important to know that upper respiratory infections do not cause serious illness or complications in most cases.   ? ?Upper respiratory infections can be transmitted from person to person, with the most common method of transmission being a person's hands.  The virus is able to live on the skin and can infect other persons for up to 2 hours after direct contact.  Also, these can be transmitted when someone coughs or sneezes; thus, it is important to cover the mouth to reduce this risk.  To keep the spread of the illness at bay, good hand hygiene is very important. ? ?This is an infection that is most likely caused by a virus. There are no specific treatments other than to help you with the symptoms until the infection runs its course.  We are sorry you are not feeling well.  Here is how we plan to help! ? ? ?For nasal congestion, you may  use an oral decongestants such as Mucinex D or if you have glaucoma or high blood pressure use plain Mucinex.  Saline nasal spray or nasal drops can help and can safely be used as often as needed for congestion.  For your congestion, I have prescribed Fluticasone nasal spray one spray in each nostril twice a day ? ?If you do not have a history of heart disease, hypertension, diabetes or thyroid disease, prostate/bladder issues or glaucoma, you may also use Sudafed to treat nasal congestion.  It is highly recommended that you consult with a pharmacist or your primary care physician to ensure this medication is safe for you to take.    ? ?If you have a cough, you may use cough suppressants such as Delsym and Robitussin.  If you have glaucoma or high blood pressure, you can also use Coricidin HBP.   ?For cough I have prescribed for you A prescription cough medication called Tessalon Perles 100 mg. You may take 1-2 capsules every 8 hours as needed for cough ? ?If you have a sore or scratchy throat, use a saltwater gargle- ? to ? teaspoon of salt dissolved in a 4-ounce to 8-ounce glass of warm water.  Gargle the solution for approximately 15-30 seconds and then spit.  It is important not to swallow the solution.  You can also use throat lozenges/cough drops and Chloraseptic spray to help with  throat pain or discomfort.  Warm or cold liquids can also be helpful in relieving throat pain. ? ?For headache, pain or general discomfort, you can use Ibuprofen or Tylenol as directed.   ?Some authorities believe that zinc sprays or the use of Echinacea may shorten the course of your symptoms. ? ? ?HOME CARE ?Only take medications as instructed by your medical team. ?Be sure to drink plenty of fluids. Water is fine as well as fruit juices, sodas and electrolyte beverages. You may want to stay away from caffeine or alcohol. If you are nauseated, try taking small sips of liquids. How do you know if you are getting enough fluid? Your  urine should be a pale yellow or almost colorless. ?Get rest. ?Taking a steamy shower or using a humidifier may help nasal congestion and ease sore throat pain. You can place a towel over your head and breathe in the steam from hot water coming from a faucet. ?Using a saline nasal spray works much the same way. ?Cough drops, hard candies and sore throat lozenges may ease your cough. ?Avoid close contacts especially the very young and the elderly ?Cover your mouth if you cough or sneeze ?Always remember to wash your hands.  ? ?GET HELP RIGHT AWAY IF: ?You develop worsening fever. ?If your symptoms do not improve within 10 days ?You develop yellow or green discharge from your nose over 3 days. ?You have coughing fits ?You develop a severe head ache or visual changes. ?You develop shortness of breath, difficulty breathing or start having chest pain ?Your symptoms persist after you have completed your treatment plan ? ?MAKE SURE YOU  ?Understand these instructions. ?Will watch your condition. ?Will get help right away if you are not doing well or get worse. ? ?Thank you for choosing an e-visit. ? ?Your e-visit answers were reviewed by a board certified advanced clinical practitioner to complete your personal care plan. Depending upon the condition, your plan could have included both over the counter or prescription medications. ? ?Please review your pharmacy choice. Make sure the pharmacy is open so you can pick up prescription now. If there is a problem, you may contact your provider through [employer] and have the prescription routed to another pharmacy.  Your safety is important to [place]. If you have drug allergies check your prescription carefully.  ? ?For the next 24 hours you can use MyChart to ask questions about today's visit, request a non-urgent call back, or ask for a work or school excuse. ?You will get an email in the next two days asking about your experience. I hope that your e-visit has been valuable  and will speed your recovery. ? ? ?Approximately 5 minutes was spent documenting and reviewing patient's chart. ? ? ?

## 2021-03-31 LAB — FETAL NONSTRESS TEST

## 2021-06-07 ENCOUNTER — Encounter: Payer: Self-pay | Admitting: Certified Nurse Midwife

## 2021-07-12 ENCOUNTER — Encounter: Payer: Self-pay | Admitting: Certified Nurse Midwife

## 2021-07-12 ENCOUNTER — Ambulatory Visit (INDEPENDENT_AMBULATORY_CARE_PROVIDER_SITE_OTHER): Payer: Self-pay | Admitting: Certified Nurse Midwife

## 2021-07-12 VITALS — BP 122/75 | HR 68 | Ht 64.0 in | Wt 195.3 lb

## 2021-07-12 DIAGNOSIS — Z30431 Encounter for routine checking of intrauterine contraceptive device: Secondary | ICD-10-CM

## 2021-07-12 DIAGNOSIS — R102 Pelvic and perineal pain: Secondary | ICD-10-CM

## 2021-07-12 LAB — POCT URINALYSIS DIPSTICK
Bilirubin, UA: NEGATIVE
Blood, UA: NEGATIVE
Glucose, UA: NEGATIVE
Ketones, UA: NEGATIVE
Leukocytes, UA: NEGATIVE
Nitrite, UA: NEGATIVE
Protein, UA: NEGATIVE
Spec Grav, UA: 1.025 (ref 1.010–1.025)
Urobilinogen, UA: 0.2 E.U./dL
pH, UA: 6 (ref 5.0–8.0)

## 2021-07-12 NOTE — Patient Instructions (Signed)
Pelvic Pain, Female Pelvic pain is pain in your lower belly (abdomen), below your belly button and between your hips. The pain may: Start all of a sudden (be acute). Keep coming back (be recurring). Last a long time (become chronic). Pelvic pain that lasts longer than 6 months is called chronic pelvic pain. There are many causes of pelvic pain. Sometimes the cause of pelvic pain is not known. Follow these instructions at home:  Take over-the-counter and prescription medicines only as told by your doctor. Rest as told by your doctor. Do not have sex if it hurts. Keep a journal of your pelvic pain. Write down: When the pain started. Where the pain is located. What seems to make the pain better or worse, such as food or your monthly period (menstrual cycle). Any symptoms you have along with the pain. Keep all follow-up visits. Contact a doctor if: Medicine does not help your pain, or your pain comes back. You have new symptoms. You have unusual discharge or bleeding from your vagina. You have a fever or chills. You are having trouble pooping (constipation). You have blood in your pee (urine) or poop (stool). Your pee smells bad. You feel weak or light-headed. Get help right away if: You have sudden pain that is very bad. You have very bad pain and also have any of these symptoms: A fever. Feeling like you may vomit (nauseous). Vomiting. Being very sweaty. You faint. These symptoms may be an emergency. Get help right away. Call your local emergency services (911 in the [place] Do not wait to see if the symptoms will go away. Do not drive yourself to the hospital. Summary Pelvic pain is pain in your lower belly (abdomen), below your belly button and between your hips. There are many causes of pelvic pain. Keep a journal of your pelvic pain. This information is not intended to replace advice given to you by your health care provider. Make sure you discuss any questions you have with  your health care provider. Document Revised: 05/10/2020 Document Reviewed: 05/10/2020 Elsevier Patient Education  2023 Elsevier Inc.   

## 2021-07-12 NOTE — Progress Notes (Signed)
GYN ENCOUNTER NOTE  Subjective:       Tracy Stevens is a 26 y.o. G16P2002 female is here for gynecologic evaluation of the following issues:  1. Lower abdominal pelvic pain , that is intermittent , it occurs several times during the month. It can be mild to strong -taking her breath away. She denies any particular activity that makes it worse and states  that it is not  with her period. She denies any pain with intercourse . She also denies any signs /symptoms of vaginal infection.    Gynecologic History No LMP recorded (lmp unknown). Contraception: IUD Last Pap: 09/13/2017. Results were: normal Last mammogram: n/a .   Obstetric History OB History  Gravida Para Term Preterm AB Living  2 2 2     2   SAB IAB Ectopic Multiple Live Births        0 2    # Outcome Date GA Lbr Len/2nd Weight Sex Delivery Anes PTL Lv  2 Term 06/28/20 [redacted]w[redacted]d / 00:13 7 lb 11.5 oz (3.5 kg) F Vag-Spont EPI  LIV  1 Term 04/14/19 [redacted]w[redacted]d 18:26 / 00:28 6 lb 14.1 oz (3.12 kg) F Vag-Spont EPI  LIV    Past Medical History:  Diagnosis Date   Congenital enlarged kidney    Headache     Past Surgical History:  Procedure Laterality Date   NASAL SINUS SURGERY      Current Outpatient Medications on File Prior to Visit  Medication Sig Dispense Refill   acetaminophen (TYLENOL) 325 MG tablet Take 650 mg by mouth every 6 (six) hours as needed.     fluticasone (FLONASE) 50 MCG/ACT nasal spray Place 2 sprays into both nostrils daily. 16 g 0   ibuprofen (ADVIL) 600 MG tablet Take 1 tablet (600 mg total) by mouth every 6 (six) hours. 30 tablet 0   Prenatal Vit-Fe Fumarate-FA (PRENATAL MULTIVITAMIN) TABS tablet Take 1 tablet by mouth daily at 12 noon.     sertraline (ZOLOFT) 25 MG tablet Take 1 tablet (25 mg total) by mouth daily. 30 tablet 6   No current facility-administered medications on file prior to visit.    No Known Allergies  Social History   Socioeconomic History   Marital status: Married    Spouse name:  [redacted]w[redacted]d   Number of children: 0   Years of education: 12   Highest education level: High school graduate  Occupational History   Occupation: CNA  Tobacco Use   Smoking status: Never   Smokeless tobacco: Never  Vaping Use   Vaping Use: Never used  Substance and Sexual Activity   Alcohol use: No   Drug use: Never   Sexual activity: Yes    Birth control/protection: I.U.D.  Other Topics Concern   Not on file  Social History Narrative   Not on file   Social Determinants of Health   Financial Resource Strain: Low Risk  (09/30/2018)   Overall Financial Resource Strain (CARDIA)    Difficulty of Paying Living Expenses: Not hard at all  Food Insecurity: No Food Insecurity (09/30/2018)   Hunger Vital Sign    Worried About Running Out of Food in the Last Year: Never true    Ran Out of Food in the Last Year: Never true  Transportation Needs: Not on file  Physical Activity: Not on file  Stress: Not on file  Social Connections: Not on file  Intimate Partner Violence: Not on file    Family History  Problem Relation Age of Onset  Stroke Mother    Multiple sclerosis Mother    Diabetes Maternal Grandmother    Cancer Maternal Grandmother    Diabetes Maternal Grandfather    Diabetes Paternal Grandfather    Heart disease Paternal Grandfather    Asthma Sister    Epilepsy Sister     The following portions of the patient's history were reviewed and updated as appropriate: allergies, current medications, past family history, past medical history, past social history, past surgical history and problem list.  Review of Systems Review of Systems - Negative except as mentioned in HPI Review of Systems - General ROS: negative for - chills, fatigue, fever, hot flashes, malaise or night sweats Hematological and Lymphatic ROS: negative for - bleeding problems or swollen lymph nodes Gastrointestinal ROS: negative for - abdominal pain, blood in stools, change in bowel habits and  nausea/vomiting Musculoskeletal ROS: negative for - joint pain, muscle pain or muscular weakness Genito-Urinary ROS: negative for - change in menstrual cycle, dysmenorrhea, dyspareunia, dysuria, genital discharge, genital ulcers, hematuria, incontinence, irregular/heavy menses, nocturia or pelvic painjj  Objective:   BP 122/75   Pulse 68   Ht 5\' 4"  (1.626 m)   Wt 195 lb 4.8 oz (88.6 kg)   LMP  (LMP Unknown)   Breastfeeding No   BMI 33.52 kg/m  CONSTITUTIONAL: Well-developed, well-nourished female in no acute distress.  HENT:  Normocephalic, atraumatic.  NECK: Normal range of motion, supple, no masses.  Normal thyroid.  SKIN: Skin is warm and dry. No rash noted. Not diaphoretic. No erythema. No pallor. NEUROLGIC: Alert and oriented to person, place, and time. PSYCHIATRIC: Normal mood and affect. Normal behavior. Normal judgment and thought content. CARDIOVASCULAR:Not Examined RESPIRATORY: Not Examined BREASTS: Not Examined ABDOMEN: Soft, non distended; Non tender.  No Organomegaly. PELVIC:  External Genitalia: Normal  BUS: Normal  Vagina: Normal  Cervix: Normal  Uterus: Normal size, shape,consistency, mobile, non tender on exam   Adnexa: Normal, no tenderness noted   RV: Normal   Bladder: Nontender MUSCULOSKELETAL: Normal range of motion. No tenderness.  No cyanosis, clubbing, or edema.     Assessment:   1. Pelvic pain - PELVIC COMPLETE WITH TRANSVAGINAL; Future  2. IUD check up - US PELVIC COMPLETE WITH TRANSVAGINAL; Future     Plan:   Discussed possible cause of pelvic pain including IUD not incorrect location, infection, cysts , fibroid.  Recommend u/s to confirm correct IUD placement and evaluation for other causes of pain. She verbalizes and agrees. Discussed pelvic floor therapy should pain continue and u/s is normal. She agrees. Will follow up with results of u/s.   [place], CNM

## 2021-07-14 LAB — URINE CULTURE

## 2021-07-20 ENCOUNTER — Ambulatory Visit: Payer: No Typology Code available for payment source

## 2021-08-08 ENCOUNTER — Other Ambulatory Visit: Payer: Self-pay | Admitting: Family Medicine

## 2021-08-08 DIAGNOSIS — G43111 Migraine with aura, intractable, with status migrainosus: Secondary | ICD-10-CM

## 2021-08-11 ENCOUNTER — Ambulatory Visit
Admission: RE | Admit: 2021-08-11 | Discharge: 2021-08-11 | Disposition: A | Discharge status: Home / Self Care | Payer: No Typology Code available for payment source | Source: Ambulatory Visit | Attending: Family Medicine | Admitting: Family Medicine

## 2021-08-11 DIAGNOSIS — G43111 Migraine with aura, intractable, with status migrainosus: Secondary | ICD-10-CM | POA: Insufficient documentation

## 2021-08-11 MED ORDER — GADOBUTROL 1 MMOL/ML IV SOLN
7.5000 mL | Freq: Once | INTRAVENOUS | Status: AC | PRN
  Administered 2021-08-11: 7.5 mL via INTRAVENOUS

## 2021-09-20 ENCOUNTER — Telehealth: Payer: No Typology Code available for payment source | Admitting: Physician Assistant

## 2021-09-20 DIAGNOSIS — B9689 Other specified bacterial agents as the cause of diseases classified elsewhere: Secondary | ICD-10-CM | POA: Diagnosis not present

## 2021-09-20 DIAGNOSIS — J019 Acute sinusitis, unspecified: Secondary | ICD-10-CM | POA: Diagnosis not present

## 2021-09-20 MED ORDER — CIPROFLOXACIN-HYDROCORTISONE 0.2-1 % OT SUSP: 3.0000 [drp] | Freq: Two times a day (BID) | OTIC | 0 refills | Status: AC

## 2021-09-20 MED ORDER — AMOXICILLIN-POT CLAVULANATE 875-125 MG PO TABS: 1.0000 | ORAL_TABLET | Freq: Two times a day (BID) | ORAL | 0 refills | Status: DC

## 2021-09-20 NOTE — Progress Notes (Signed)
E-Visit for Sinus Problems  We are sorry that you are not feeling well.  Here is how we plan to help!  Based on what you have shared with me it looks like you have sinusitis.  Sinusitis is inflammation and infection in the sinus cavities of the head.  Based on your presentation I believe you most likely have Acute Bacterial Sinusitis.  This is an infection caused by bacteria and is treated with antibiotics. I have prescribed Augmentin 875mg /125mg  one tablet twice daily with food, for 7 days. You may use an oral decongestant such as Mucinex D or if you have glaucoma or high blood pressure use plain Mucinex. Saline nasal spray help and can safely be used as often as needed for congestion.  I  I have also sent in a ear drop,   f you develop worsening sinus pain, fever or notice severe headache and vision changes, or if symptoms are not better after completion of antibiotic, please schedule an appointment with a health care provider.    Sinus infections are not as easily transmitted as other respiratory infection, however we still recommend that you avoid close contact with loved ones, especially the very young and elderly.  Remember to wash your hands thoroughly throughout the day as this is the number one way to prevent the spread of infection!  Home Care: Only take medications as instructed by your medical team. Complete the entire course of an antibiotic. Do not take these medications with alcohol. A steam or ultrasonic humidifier can help congestion.  You can place a towel over your head and breathe in the steam from hot water coming from a faucet. Avoid close contacts especially the very young and the elderly. Cover your mouth when you cough or sneeze. Always remember to wash your hands.  Get Help Right Away If: You develop worsening fever or sinus pain. You develop a severe head ache or visual changes. Your symptoms persist after you have completed your treatment plan.  Make sure  you Understand these instructions. Will watch your condition. Will get help right away if you are not doing well or get worse.  Thank you for choosing an e-visit.  Your e-visit answers were reviewed by a board certified advanced clinical practitioner to complete your personal care plan. Depending upon the condition, your plan could have included both over the counter or prescription medications.  Please review your pharmacy choice. Make sure the pharmacy is open so you can pick up prescription now. If there is a problem, you may contact your provider through and have the prescription routed to another pharmacy.  Your safety is important to [employer]. If you have drug allergies check your prescription carefully.   For the next 24 hours you can use MyChart to ask questions about today's visit, request a non-urgent call back, or ask for a work or school excuse. You will get an email in the next two days asking about your experience. I hope that your e-visit has been valuable and will speed your recovery.

## 2021-09-20 NOTE — Progress Notes (Signed)
I have spent 5 minutes in review of e-visit questionnaire, review and updating patient chart, medical decision making and response to patient.   Gracianna Vink Cody Nikash Mortensen, PA-C    

## 2021-09-20 NOTE — Progress Notes (Signed)
Message sent to patient requesting further input regarding current symptoms. Awaiting patient response.  

## 2021-10-24 ENCOUNTER — Encounter: Payer: Self-pay | Admitting: Certified Nurse Midwife

## 2022-01-24 ENCOUNTER — Other Ambulatory Visit: Payer: Self-pay

## 2022-01-24 DIAGNOSIS — M5441 Lumbago with sciatica, right side: Secondary | ICD-10-CM | POA: Diagnosis not present

## 2022-01-24 DIAGNOSIS — J069 Acute upper respiratory infection, unspecified: Secondary | ICD-10-CM | POA: Diagnosis not present

## 2022-01-24 DIAGNOSIS — M419 Scoliosis, unspecified: Secondary | ICD-10-CM | POA: Diagnosis not present

## 2022-01-24 DIAGNOSIS — G8929 Other chronic pain: Secondary | ICD-10-CM | POA: Diagnosis not present

## 2022-01-24 DIAGNOSIS — Z6836 Body mass index (BMI) 36.0-36.9, adult: Secondary | ICD-10-CM | POA: Diagnosis not present

## 2022-01-24 MED ORDER — PHENTERMINE HCL 37.5 MG PO TABS
37.5000 mg | ORAL_TABLET | Freq: Every morning | ORAL | 0 refills | Status: DC
  Filled 2022-01-24: qty 30, 30d supply, fill #0

## 2022-01-24 MED ORDER — CYCLOBENZAPRINE HCL 5 MG PO TABS
ORAL_TABLET | ORAL | 0 refills | Status: AC
  Filled 2022-01-24: qty 30, 30d supply, fill #0

## 2022-01-24 MED ORDER — BENZONATATE 200 MG PO CAPS
ORAL_CAPSULE | ORAL | 0 refills | Status: DC
  Filled 2022-01-24: qty 20, 7d supply, fill #0

## 2022-01-24 MED ORDER — AZITHROMYCIN 250 MG PO TABS
ORAL_TABLET | ORAL | 0 refills | Status: DC
  Filled 2022-01-24: qty 6, 5d supply, fill #0

## 2022-01-30 ENCOUNTER — Other Ambulatory Visit: Payer: Self-pay | Admitting: Family Medicine

## 2022-01-30 DIAGNOSIS — M419 Scoliosis, unspecified: Secondary | ICD-10-CM

## 2022-01-30 DIAGNOSIS — G8929 Other chronic pain: Secondary | ICD-10-CM

## 2022-02-07 ENCOUNTER — Ambulatory Visit
Admission: RE | Admit: 2022-02-07 | Discharge: 2022-02-07 | Disposition: A | Discharge status: Home / Self Care | Payer: 59 | Source: Ambulatory Visit | Attending: Family Medicine | Admitting: Family Medicine

## 2022-02-07 DIAGNOSIS — M419 Scoliosis, unspecified: Secondary | ICD-10-CM | POA: Insufficient documentation

## 2022-02-07 DIAGNOSIS — M5136 Other intervertebral disc degeneration, lumbar region: Secondary | ICD-10-CM | POA: Diagnosis not present

## 2022-02-07 DIAGNOSIS — M5441 Lumbago with sciatica, right side: Secondary | ICD-10-CM | POA: Diagnosis not present

## 2022-02-07 DIAGNOSIS — G8929 Other chronic pain: Secondary | ICD-10-CM | POA: Diagnosis not present

## 2022-02-20 ENCOUNTER — Other Ambulatory Visit: Payer: Self-pay

## 2022-02-22 ENCOUNTER — Other Ambulatory Visit: Payer: Self-pay

## 2022-02-23 ENCOUNTER — Other Ambulatory Visit: Payer: Self-pay

## 2022-02-23 MED ORDER — CYCLOBENZAPRINE HCL 5 MG PO TABS
5.0000 mg | ORAL_TABLET | Freq: Every evening | ORAL | 4 refills | Status: DC | PRN
  Filled 2022-02-23: qty 30, 30d supply, fill #0
  Filled 2022-05-04 – 2022-05-07 (×2): qty 30, 30d supply, fill #1
  Filled 2022-06-27: qty 30, 30d supply, fill #2
  Filled 2022-12-17 – 2022-12-31 (×2): qty 30, 30d supply, fill #3
  Filled 2023-02-11: qty 30, 30d supply, fill #4

## 2022-02-28 IMAGING — US US OB COMP +14 WK
1 series · 13 of 28 positions shown · non-contrast
Comparison: none

CLINICAL DATA: Previous incomplete fetal anatomic survey,
incomplete visualization of the spine, heart, and ventricular
outflow tracts

EXAM:
OBSTETRICAL ULTRASOUND >14 WKS

[Series 1: us ob comp +14 wk · 0.30mm/px · 87 acquisitions, 13 frames shown]
[im 4/87]
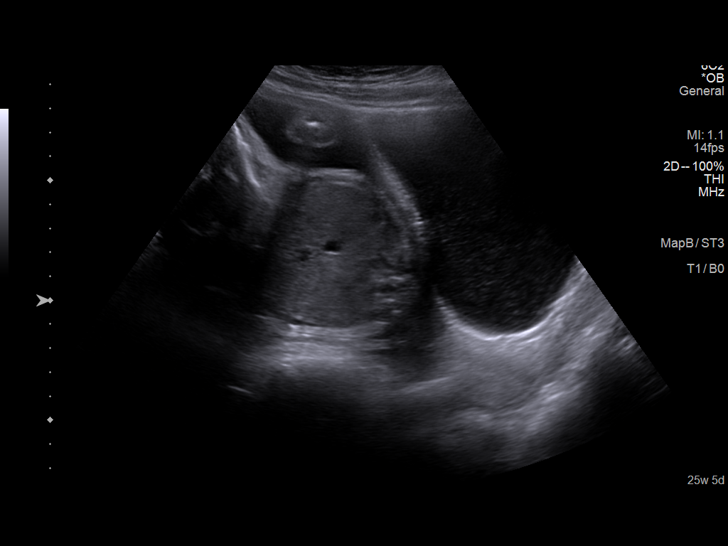
[im 10/87]
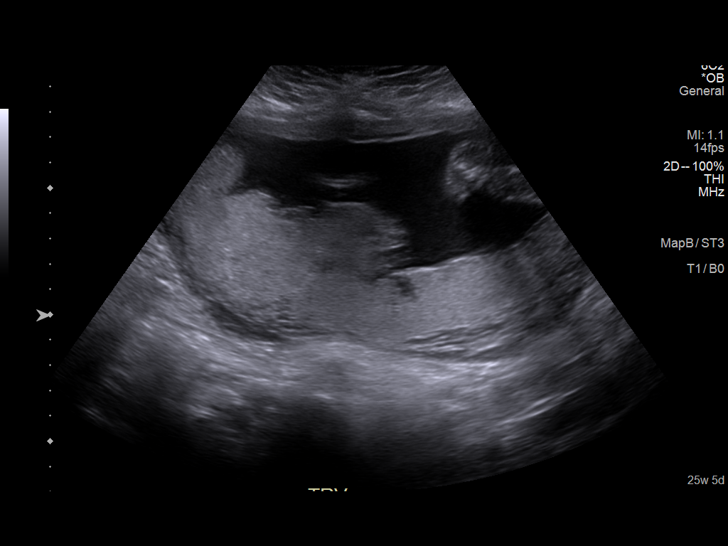
[im 16/87]
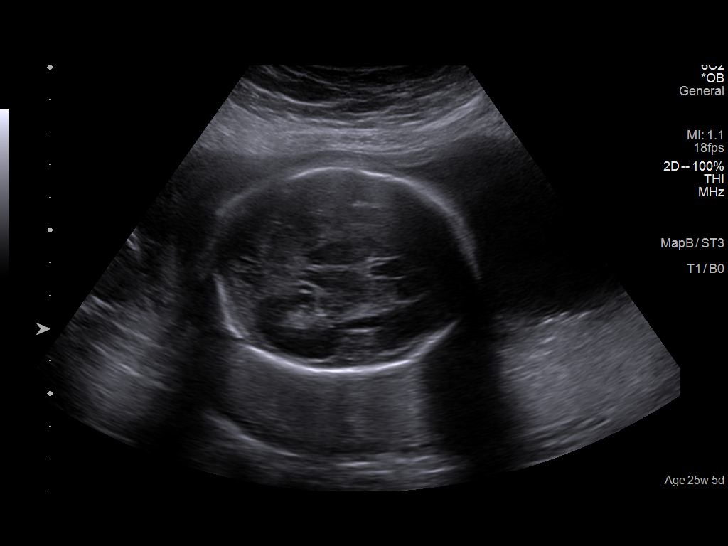
[im 23/87]
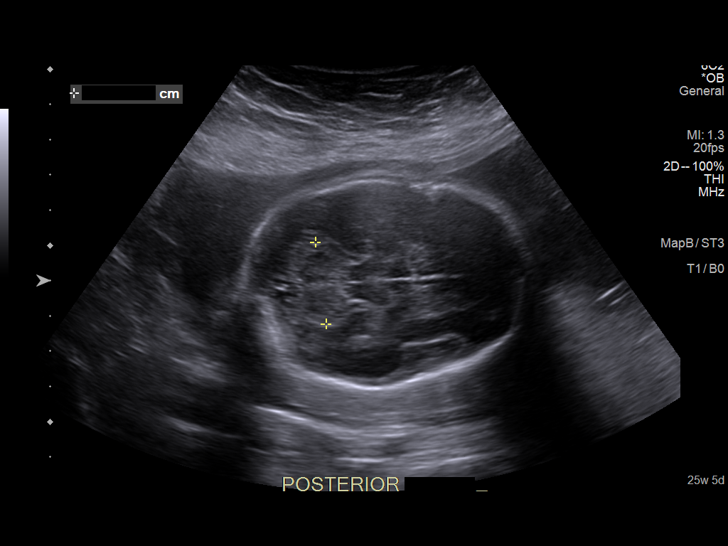
[im 29/87]
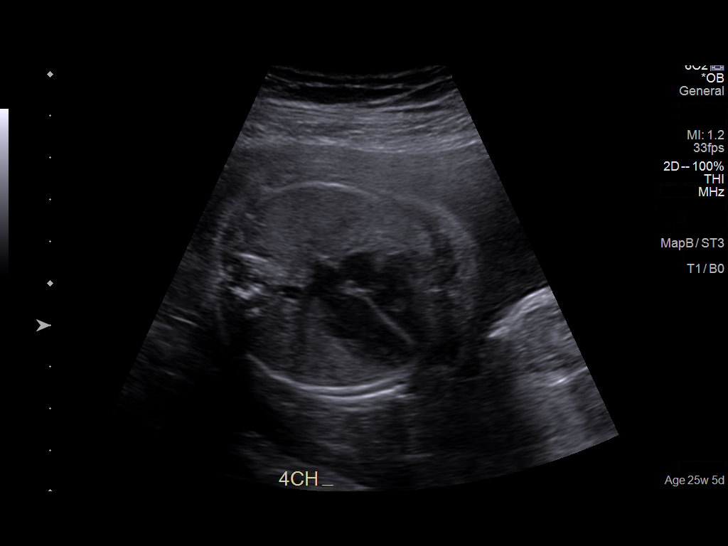
[im 36/87]
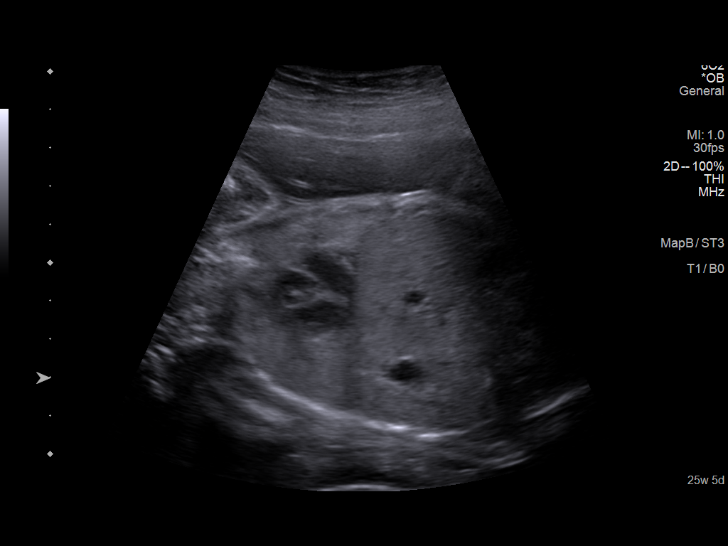
[im 45/87]
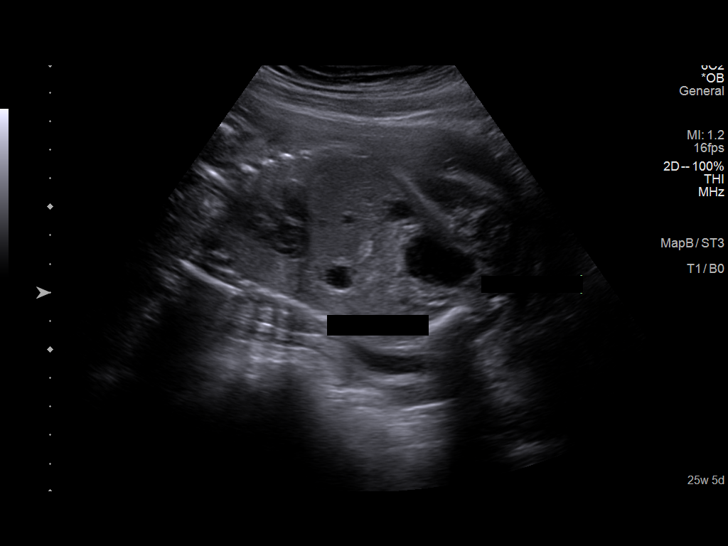
[im 51/87]
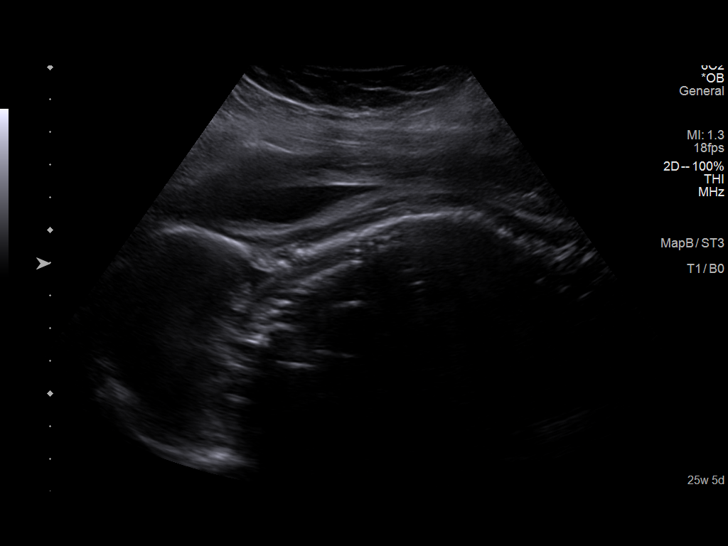
[im 58/87]
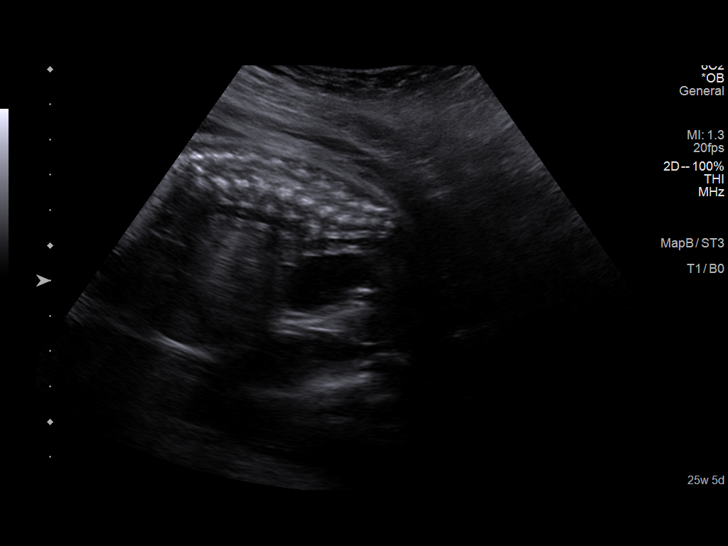
[im 64/87]
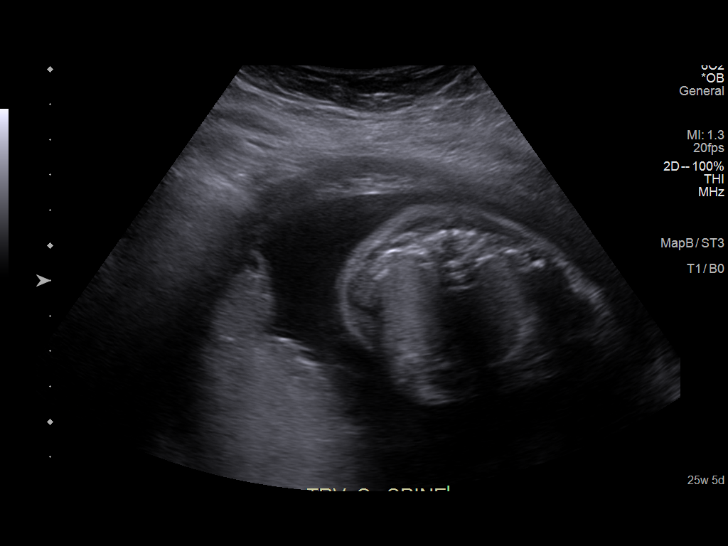
[im 71/87]
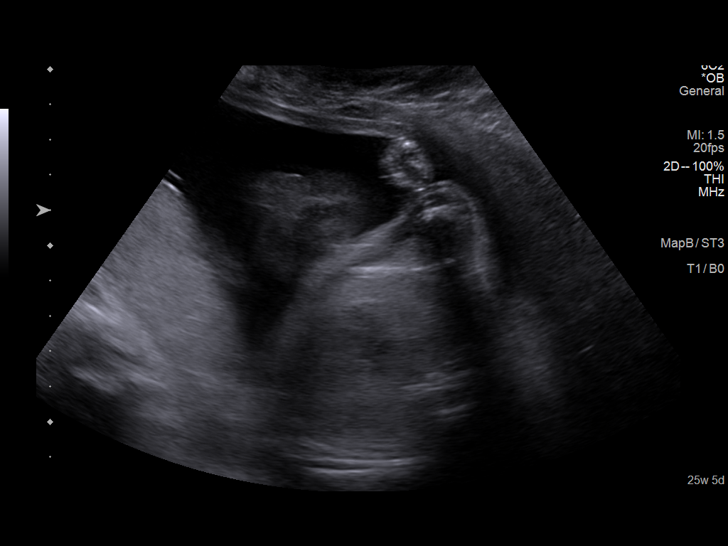
[im 77/87]
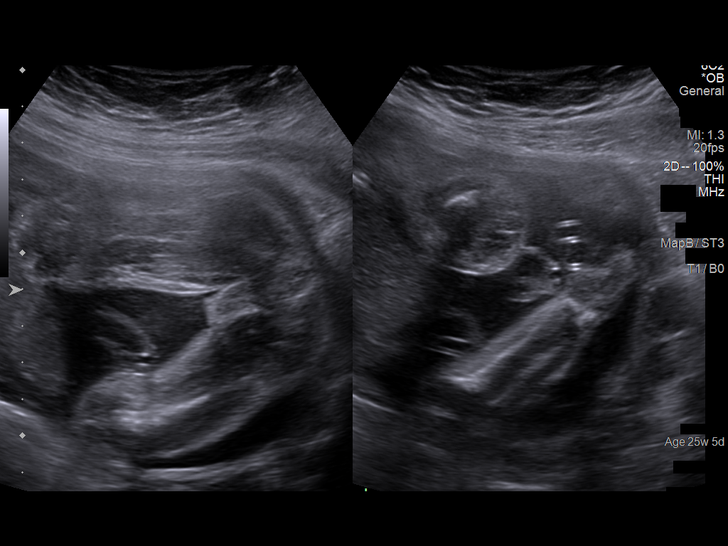
[im 83/87]
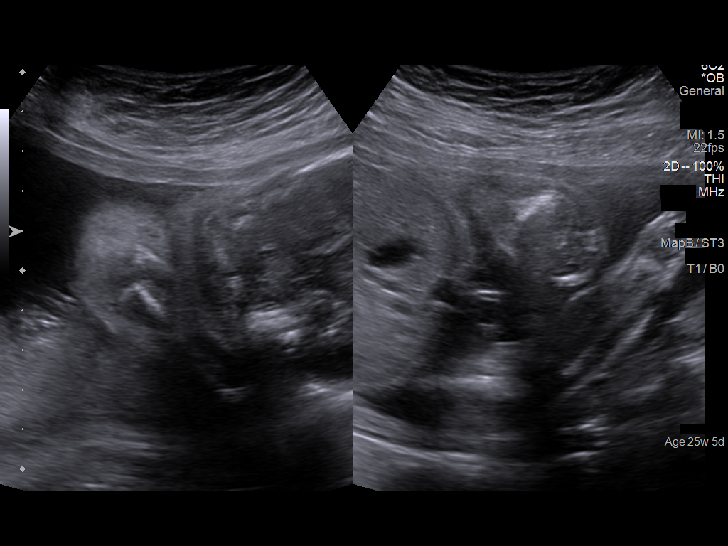

[13 of 28 positions shown; findings below may reference images not displayed]

FINDINGS: Number of Fetuses: 1

Heart Rate:  139 bpm

Movement: Yes

Presentation: Breech

Previa: No

Placental Location: Posterior

Amniotic Fluid (Subjective): Normal

Amniotic Fluid (Objective):

Vertical pocket = 4.5cm

FETAL BIOMETRY

BPD: 6.2cm 25w 1d

HC:   23.9cm 25w 6d

AC:   21.3cm 25w 6d

FL:   4.6cm 25w 1d

Current Mean GA: 25w 3d US EDC: 07/01/2020

Assigned GA:  25w 5d Assigned EDC: 06/29/2020

FETAL ANATOMY

Lateral Ventricles: Appears normal

Thalami/CSP: Appears normal

Posterior Fossa:  Appears normal

Nuchal Region: Appears normal

Upper Lip: Appears normal

Spine: Appears normal

4 Chamber Heart on Left: Appears normal

LVOT: Appears normal

RVOT: Appears normal

Stomach on Left: Appears normal

3 Vessel Cord: Appears normal

Cord Insertion site: Appears normal

Kidneys: Appears normal

Bladder: Appears normal

Extremities: Appears normal

Technically difficult due to: None

Maternal Findings:

Cervix:  4.3 cm
IMPRESSION: 1. Single live intrauterine pregnancy as above, estimated age 25
weeks and 3 days.
2. Normal fetal anatomic survey.

## 2022-03-02 ENCOUNTER — Other Ambulatory Visit: Payer: Self-pay

## 2022-03-06 ENCOUNTER — Other Ambulatory Visit: Payer: Self-pay

## 2022-03-13 ENCOUNTER — Other Ambulatory Visit: Payer: Self-pay

## 2022-03-13 DIAGNOSIS — E663 Overweight: Secondary | ICD-10-CM | POA: Diagnosis not present

## 2022-03-13 DIAGNOSIS — G8929 Other chronic pain: Secondary | ICD-10-CM | POA: Diagnosis not present

## 2022-03-13 DIAGNOSIS — M545 Low back pain, unspecified: Secondary | ICD-10-CM | POA: Diagnosis not present

## 2022-03-13 DIAGNOSIS — Z1211 Encounter for screening for malignant neoplasm of colon: Secondary | ICD-10-CM | POA: Diagnosis not present

## 2022-03-13 MED ORDER — PHENTERMINE HCL 37.5 MG PO TABS
37.5000 mg | ORAL_TABLET | Freq: Every morning | ORAL | 0 refills | Status: AC
  Filled 2022-03-13: qty 30, 30d supply, fill #0

## 2022-03-13 MED ORDER — MONTELUKAST SODIUM 10 MG PO TABS
10.0000 mg | ORAL_TABLET | Freq: Every day | ORAL | 11 refills | Status: AC
  Filled 2022-03-13: qty 30, 30d supply, fill #0
  Filled 2022-09-03: qty 30, 30d supply, fill #1

## 2022-03-27 DIAGNOSIS — M545 Low back pain, unspecified: Secondary | ICD-10-CM | POA: Diagnosis not present

## 2022-03-27 DIAGNOSIS — G8929 Other chronic pain: Secondary | ICD-10-CM | POA: Diagnosis not present

## 2022-03-30 ENCOUNTER — Other Ambulatory Visit: Payer: Self-pay

## 2022-03-30 DIAGNOSIS — F411 Generalized anxiety disorder: Secondary | ICD-10-CM | POA: Diagnosis not present

## 2022-03-30 DIAGNOSIS — F331 Major depressive disorder, recurrent, moderate: Secondary | ICD-10-CM | POA: Diagnosis not present

## 2022-03-30 MED ORDER — SERTRALINE HCL 50 MG PO TABS
75.0000 mg | ORAL_TABLET | Freq: Every day | ORAL | 2 refills | Status: DC
  Filled 2022-03-30: qty 45, 30d supply, fill #0
  Filled 2022-05-04 – 2022-05-07 (×2): qty 45, 30d supply, fill #1
  Filled 2022-06-27: qty 45, 30d supply, fill #2

## 2022-04-27 ENCOUNTER — Other Ambulatory Visit: Payer: Self-pay

## 2022-04-27 DIAGNOSIS — M545 Low back pain, unspecified: Secondary | ICD-10-CM | POA: Diagnosis not present

## 2022-04-27 DIAGNOSIS — G8929 Other chronic pain: Secondary | ICD-10-CM | POA: Diagnosis not present

## 2022-04-27 MED ORDER — PROPRANOLOL HCL 10 MG PO TABS
10.0000 mg | ORAL_TABLET | Freq: Two times a day (BID) | ORAL | 11 refills | Status: AC
  Filled 2022-04-27: qty 60, 30d supply, fill #0
  Filled 2022-06-27: qty 60, 30d supply, fill #1
  Filled 2022-11-26: qty 60, 30d supply, fill #2
  Filled 2023-02-11: qty 60, 30d supply, fill #3

## 2022-05-04 ENCOUNTER — Other Ambulatory Visit: Payer: Self-pay

## 2022-05-04 DIAGNOSIS — J329 Chronic sinusitis, unspecified: Secondary | ICD-10-CM | POA: Diagnosis not present

## 2022-05-04 MED ORDER — SUMATRIPTAN SUCCINATE 25 MG PO TABS
25.0000 mg | ORAL_TABLET | ORAL | 0 refills | Status: DC
  Filled 2022-05-04: qty 9, 15d supply, fill #0

## 2022-05-04 MED ORDER — TRIAMCINOLONE ACETONIDE 55 MCG/ACT NA AERO
2.0000 | INHALATION_SPRAY | Freq: Every day | NASAL | 12 refills | Status: DC
  Filled 2022-05-04 – 2022-06-27 (×2): qty 16.9, 30d supply, fill #0

## 2022-05-04 MED ORDER — AZELASTINE HCL 0.1 % NA SOLN
1.0000 | Freq: Two times a day (BID) | NASAL | 11 refills | Status: AC
  Filled 2022-05-04: qty 30, 30d supply, fill #0
  Filled 2022-09-03: qty 30, 30d supply, fill #1
  Filled 2022-10-24: qty 30, 30d supply, fill #2

## 2022-05-07 ENCOUNTER — Other Ambulatory Visit (HOSPITAL_COMMUNITY): Payer: Self-pay

## 2022-05-07 ENCOUNTER — Other Ambulatory Visit: Payer: Self-pay

## 2022-05-10 ENCOUNTER — Other Ambulatory Visit: Payer: Self-pay

## 2022-05-10 ENCOUNTER — Other Ambulatory Visit (HOSPITAL_COMMUNITY): Payer: Self-pay

## 2022-05-10 MED ORDER — AMOXICILLIN-POT CLAVULANATE 875-125 MG PO TABS
ORAL_TABLET | ORAL | 0 refills | Status: DC
  Filled 2022-05-10: qty 14, 7d supply, fill #0

## 2022-05-22 DIAGNOSIS — M545 Low back pain, unspecified: Secondary | ICD-10-CM | POA: Diagnosis not present

## 2022-05-22 DIAGNOSIS — G8929 Other chronic pain: Secondary | ICD-10-CM | POA: Diagnosis not present

## 2022-06-13 ENCOUNTER — Other Ambulatory Visit: Payer: Self-pay

## 2022-06-15 ENCOUNTER — Other Ambulatory Visit: Payer: Self-pay

## 2022-06-15 MED ORDER — KETOCONAZOLE 2 % EX SHAM
1.0000 | MEDICATED_SHAMPOO | Freq: Once | CUTANEOUS | 1 refills | Status: AC
  Filled 2022-06-15: qty 120, 24d supply, fill #0

## 2022-06-27 ENCOUNTER — Other Ambulatory Visit: Payer: Self-pay

## 2022-06-28 ENCOUNTER — Other Ambulatory Visit: Payer: Self-pay

## 2022-06-28 DIAGNOSIS — M79605 Pain in left leg: Secondary | ICD-10-CM | POA: Diagnosis not present

## 2022-06-28 DIAGNOSIS — G43111 Migraine with aura, intractable, with status migrainosus: Secondary | ICD-10-CM | POA: Diagnosis not present

## 2022-06-28 DIAGNOSIS — E781 Pure hyperglyceridemia: Secondary | ICD-10-CM | POA: Diagnosis not present

## 2022-06-28 DIAGNOSIS — F331 Major depressive disorder, recurrent, moderate: Secondary | ICD-10-CM | POA: Diagnosis not present

## 2022-06-28 DIAGNOSIS — L659 Nonscarring hair loss, unspecified: Secondary | ICD-10-CM | POA: Diagnosis not present

## 2022-06-28 DIAGNOSIS — F411 Generalized anxiety disorder: Secondary | ICD-10-CM | POA: Diagnosis not present

## 2022-06-28 DIAGNOSIS — M79604 Pain in right leg: Secondary | ICD-10-CM | POA: Diagnosis not present

## 2022-06-28 DIAGNOSIS — Z6835 Body mass index (BMI) 35.0-35.9, adult: Secondary | ICD-10-CM | POA: Diagnosis not present

## 2022-06-28 MED ORDER — VENLAFAXINE HCL ER 37.5 MG PO CP24
37.5000 mg | ORAL_CAPSULE | Freq: Every day | ORAL | 5 refills | Status: DC
  Filled 2022-06-28: qty 30, 30d supply, fill #0

## 2022-07-03 ENCOUNTER — Other Ambulatory Visit: Payer: Self-pay

## 2022-07-03 MED ORDER — VITAMIN D (ERGOCALCIFEROL) 1.25 MG (50000 UNIT) PO CAPS
50000.0000 [IU] | ORAL_CAPSULE | ORAL | 0 refills | Status: AC
  Filled 2022-07-03: qty 8, 56d supply, fill #0

## 2022-07-04 ENCOUNTER — Other Ambulatory Visit: Payer: Self-pay

## 2022-07-04 DIAGNOSIS — Z800 Family history of malignant neoplasm of digestive organs: Secondary | ICD-10-CM | POA: Diagnosis not present

## 2022-07-04 DIAGNOSIS — Z83719 Family history of colon polyps, unspecified: Secondary | ICD-10-CM | POA: Diagnosis not present

## 2022-07-04 MED ORDER — NA SULFATE-K SULFATE-MG SULF 17.5-3.13-1.6 GM/177ML PO SOLN
1.0000 | ORAL | 0 refills | Status: DC
  Filled 2022-07-04 – 2022-09-03 (×2): qty 354, 1d supply, fill #0

## 2022-07-10 ENCOUNTER — Other Ambulatory Visit: Payer: Self-pay

## 2022-07-10 MED ORDER — CLOBETASOL PROPIONATE 0.05 % EX SOLN
1.0000 | Freq: Two times a day (BID) | CUTANEOUS | 0 refills | Status: DC
  Filled 2022-07-10: qty 50, 30d supply, fill #0

## 2022-07-12 ENCOUNTER — Other Ambulatory Visit: Payer: Self-pay

## 2022-07-24 ENCOUNTER — Other Ambulatory Visit: Payer: Self-pay

## 2022-07-24 DIAGNOSIS — J328 Other chronic sinusitis: Secondary | ICD-10-CM | POA: Diagnosis not present

## 2022-07-24 DIAGNOSIS — J331 Polypoid sinus degeneration: Secondary | ICD-10-CM | POA: Diagnosis not present

## 2022-07-24 DIAGNOSIS — J301 Allergic rhinitis due to pollen: Secondary | ICD-10-CM | POA: Diagnosis not present

## 2022-07-24 MED ORDER — PREDNISONE 10 MG PO TABS
ORAL_TABLET | ORAL | 0 refills | Status: AC
  Filled 2022-07-24: qty 42, 12d supply, fill #0

## 2022-07-30 ENCOUNTER — Other Ambulatory Visit: Payer: Self-pay

## 2022-07-30 DIAGNOSIS — Z6835 Body mass index (BMI) 35.0-35.9, adult: Secondary | ICD-10-CM | POA: Diagnosis not present

## 2022-07-30 DIAGNOSIS — L659 Nonscarring hair loss, unspecified: Secondary | ICD-10-CM | POA: Diagnosis not present

## 2022-07-30 DIAGNOSIS — F331 Major depressive disorder, recurrent, moderate: Secondary | ICD-10-CM | POA: Diagnosis not present

## 2022-07-30 DIAGNOSIS — F411 Generalized anxiety disorder: Secondary | ICD-10-CM | POA: Diagnosis not present

## 2022-07-30 MED ORDER — SERTRALINE HCL 50 MG PO TABS
75.0000 mg | ORAL_TABLET | Freq: Every day | ORAL | 1 refills | Status: DC
  Filled 2022-07-30: qty 135, 90d supply, fill #0
  Filled 2022-11-08: qty 135, 90d supply, fill #1

## 2022-07-30 MED ORDER — VENLAFAXINE HCL ER 75 MG PO CP24
ORAL_CAPSULE | ORAL | 1 refills | Status: DC
  Filled 2022-07-30: qty 30, 30d supply, fill #0
  Filled 2022-09-03: qty 30, 30d supply, fill #1

## 2022-07-30 MED ORDER — SUMATRIPTAN SUCCINATE 25 MG PO TABS
25.0000 mg | ORAL_TABLET | ORAL | 0 refills | Status: DC
  Filled 2022-07-30: qty 9, 15d supply, fill #0

## 2022-07-30 MED ORDER — PROPRANOLOL HCL 10 MG PO TABS
10.0000 mg | ORAL_TABLET | Freq: Two times a day (BID) | ORAL | 1 refills | Status: AC
  Filled 2022-07-30: qty 180, 90d supply, fill #0

## 2022-07-30 MED ORDER — CYCLOBENZAPRINE HCL 5 MG PO TABS
5.0000 mg | ORAL_TABLET | Freq: Every day | ORAL | 2 refills | Status: AC | PRN
  Filled 2022-07-30: qty 30, 30d supply, fill #0
  Filled 2022-09-19: qty 30, 30d supply, fill #1
  Filled 2022-10-24: qty 30, 30d supply, fill #2

## 2022-08-13 DIAGNOSIS — L638 Other alopecia areata: Secondary | ICD-10-CM | POA: Diagnosis not present

## 2022-08-30 DIAGNOSIS — L659 Nonscarring hair loss, unspecified: Secondary | ICD-10-CM | POA: Diagnosis not present

## 2022-08-30 DIAGNOSIS — F411 Generalized anxiety disorder: Secondary | ICD-10-CM | POA: Diagnosis not present

## 2022-08-30 DIAGNOSIS — F331 Major depressive disorder, recurrent, moderate: Secondary | ICD-10-CM | POA: Diagnosis not present

## 2022-08-30 DIAGNOSIS — Z6835 Body mass index (BMI) 35.0-35.9, adult: Secondary | ICD-10-CM | POA: Diagnosis not present

## 2022-08-31 DIAGNOSIS — J301 Allergic rhinitis due to pollen: Secondary | ICD-10-CM | POA: Diagnosis not present

## 2022-09-03 ENCOUNTER — Other Ambulatory Visit: Payer: Self-pay

## 2022-09-04 ENCOUNTER — Other Ambulatory Visit: Payer: Self-pay

## 2022-09-05 DIAGNOSIS — J330 Polyp of nasal cavity: Secondary | ICD-10-CM | POA: Diagnosis not present

## 2022-09-05 DIAGNOSIS — J301 Allergic rhinitis due to pollen: Secondary | ICD-10-CM | POA: Diagnosis not present

## 2022-09-10 ENCOUNTER — Other Ambulatory Visit: Payer: Self-pay

## 2022-09-10 MED ORDER — WEGOVY 0.25 MG/0.5ML ~~LOC~~ SOAJ
0.2500 mg | SUBCUTANEOUS | 0 refills | Status: DC
  Filled 2022-09-10: qty 2, 28d supply, fill #0

## 2022-09-14 ENCOUNTER — Other Ambulatory Visit: Payer: Self-pay

## 2022-09-18 ENCOUNTER — Other Ambulatory Visit: Payer: Self-pay

## 2022-09-18 MED ORDER — SUMATRIPTAN SUCCINATE 25 MG PO TABS
25.0000 mg | ORAL_TABLET | ORAL | 0 refills | Status: DC
  Filled 2022-09-18: qty 9, 15d supply, fill #0

## 2022-09-19 DIAGNOSIS — R300 Dysuria: Secondary | ICD-10-CM | POA: Diagnosis not present

## 2022-09-21 ENCOUNTER — Other Ambulatory Visit: Payer: Self-pay

## 2022-09-21 DIAGNOSIS — N644 Mastodynia: Secondary | ICD-10-CM | POA: Diagnosis not present

## 2022-09-21 DIAGNOSIS — R102 Pelvic and perineal pain: Secondary | ICD-10-CM | POA: Diagnosis not present

## 2022-09-21 DIAGNOSIS — N912 Amenorrhea, unspecified: Secondary | ICD-10-CM | POA: Diagnosis not present

## 2022-09-21 DIAGNOSIS — H539 Unspecified visual disturbance: Secondary | ICD-10-CM | POA: Diagnosis not present

## 2022-09-21 DIAGNOSIS — Z6836 Body mass index (BMI) 36.0-36.9, adult: Secondary | ICD-10-CM | POA: Diagnosis not present

## 2022-09-21 DIAGNOSIS — R4589 Other symptoms and signs involving emotional state: Secondary | ICD-10-CM | POA: Diagnosis not present

## 2022-09-24 DIAGNOSIS — H539 Unspecified visual disturbance: Secondary | ICD-10-CM | POA: Diagnosis not present

## 2022-09-24 DIAGNOSIS — R4589 Other symptoms and signs involving emotional state: Secondary | ICD-10-CM | POA: Diagnosis not present

## 2022-09-24 DIAGNOSIS — N644 Mastodynia: Secondary | ICD-10-CM | POA: Diagnosis not present

## 2022-09-24 DIAGNOSIS — N912 Amenorrhea, unspecified: Secondary | ICD-10-CM | POA: Diagnosis not present

## 2022-09-24 DIAGNOSIS — R102 Pelvic and perineal pain: Secondary | ICD-10-CM | POA: Diagnosis not present

## 2022-09-28 DIAGNOSIS — N912 Amenorrhea, unspecified: Secondary | ICD-10-CM | POA: Diagnosis not present

## 2022-09-28 DIAGNOSIS — H539 Unspecified visual disturbance: Secondary | ICD-10-CM | POA: Diagnosis not present

## 2022-09-28 DIAGNOSIS — R4589 Other symptoms and signs involving emotional state: Secondary | ICD-10-CM | POA: Diagnosis not present

## 2022-09-28 DIAGNOSIS — R102 Pelvic and perineal pain: Secondary | ICD-10-CM | POA: Diagnosis not present

## 2022-10-01 ENCOUNTER — Other Ambulatory Visit: Payer: Self-pay

## 2022-10-01 DIAGNOSIS — R238 Other skin changes: Secondary | ICD-10-CM | POA: Diagnosis not present

## 2022-10-01 DIAGNOSIS — B078 Other viral warts: Secondary | ICD-10-CM | POA: Diagnosis not present

## 2022-10-01 DIAGNOSIS — L638 Other alopecia areata: Secondary | ICD-10-CM | POA: Diagnosis not present

## 2022-10-01 DIAGNOSIS — L298 Other pruritus: Secondary | ICD-10-CM | POA: Diagnosis not present

## 2022-10-01 DIAGNOSIS — L538 Other specified erythematous conditions: Secondary | ICD-10-CM | POA: Diagnosis not present

## 2022-10-01 MED ORDER — CLOBETASOL PROPIONATE 0.05 % EX SOLN
CUTANEOUS | 3 refills | Status: AC
  Filled 2022-10-01: qty 50, 30d supply, fill #0

## 2022-10-02 ENCOUNTER — Other Ambulatory Visit: Payer: Self-pay | Admitting: Family Medicine

## 2022-10-02 ENCOUNTER — Other Ambulatory Visit: Payer: Self-pay

## 2022-10-02 DIAGNOSIS — R102 Pelvic and perineal pain: Secondary | ICD-10-CM

## 2022-10-02 MED ORDER — SUMATRIPTAN SUCCINATE 25 MG PO TABS
25.0000 mg | ORAL_TABLET | ORAL | 0 refills | Status: DC
  Filled 2022-10-02: qty 9, 15d supply, fill #0

## 2022-10-09 ENCOUNTER — Encounter: Payer: Self-pay | Admitting: Family Medicine

## 2022-10-09 ENCOUNTER — Other Ambulatory Visit: Payer: Self-pay

## 2022-10-09 DIAGNOSIS — R102 Pelvic and perineal pain: Secondary | ICD-10-CM

## 2022-10-10 ENCOUNTER — Ambulatory Visit
Admission: RE | Admit: 2022-10-10 | Discharge: 2022-10-10 | Disposition: A | Discharge status: Home / Self Care | Payer: 59 | Source: Ambulatory Visit | Attending: Family Medicine | Admitting: Family Medicine

## 2022-10-10 DIAGNOSIS — R102 Pelvic and perineal pain: Secondary | ICD-10-CM | POA: Insufficient documentation

## 2022-10-11 ENCOUNTER — Other Ambulatory Visit: Payer: Self-pay

## 2022-10-12 ENCOUNTER — Other Ambulatory Visit: Payer: Self-pay

## 2022-10-12 MED ORDER — VENLAFAXINE HCL ER 75 MG PO CP24
75.0000 mg | ORAL_CAPSULE | Freq: Every day | ORAL | 1 refills | Status: DC
  Filled 2022-10-12: qty 30, 30d supply, fill #0
  Filled 2022-10-24 – 2022-11-26 (×2): qty 30, 30d supply, fill #1

## 2022-10-16 ENCOUNTER — Other Ambulatory Visit: Payer: Self-pay

## 2022-10-17 ENCOUNTER — Other Ambulatory Visit: Payer: Self-pay

## 2022-10-17 MED ORDER — NA SULFATE-K SULFATE-MG SULF 17.5-3.13-1.6 GM/177ML PO SOLN
ORAL | 0 refills | Status: DC
  Filled 2022-10-17: qty 354, 1d supply, fill #0

## 2022-10-22 ENCOUNTER — Encounter: Payer: Self-pay | Admitting: *Deleted

## 2022-10-23 ENCOUNTER — Ambulatory Visit
Admission: RE | Admit: 2022-10-23 | Discharge: 2022-10-23 | Disposition: A | Discharge status: Home / Self Care | Payer: 59 | Attending: Gastroenterology | Admitting: Gastroenterology

## 2022-10-23 ENCOUNTER — Ambulatory Visit: Payer: 59 | Admitting: Anesthesiology

## 2022-10-23 ENCOUNTER — Encounter
Admission: RE | Disposition: A | Discharge status: Home / Self Care | Payer: Self-pay | Source: Home / Self Care | Attending: Gastroenterology

## 2022-10-23 ENCOUNTER — Encounter: Payer: Self-pay | Admitting: *Deleted

## 2022-10-23 DIAGNOSIS — Z83719 Family history of colon polyps, unspecified: Secondary | ICD-10-CM | POA: Diagnosis not present

## 2022-10-23 DIAGNOSIS — Z800 Family history of malignant neoplasm of digestive organs: Secondary | ICD-10-CM | POA: Diagnosis not present

## 2022-10-23 DIAGNOSIS — Z1211 Encounter for screening for malignant neoplasm of colon: Secondary | ICD-10-CM | POA: Insufficient documentation

## 2022-10-23 DIAGNOSIS — K640 First degree hemorrhoids: Secondary | ICD-10-CM | POA: Insufficient documentation

## 2022-10-23 DIAGNOSIS — D125 Benign neoplasm of sigmoid colon: Secondary | ICD-10-CM | POA: Insufficient documentation

## 2022-10-23 DIAGNOSIS — G4730 Sleep apnea, unspecified: Secondary | ICD-10-CM | POA: Insufficient documentation

## 2022-10-23 DIAGNOSIS — Z6836 Body mass index (BMI) 36.0-36.9, adult: Secondary | ICD-10-CM | POA: Diagnosis not present

## 2022-10-23 DIAGNOSIS — K635 Polyp of colon: Secondary | ICD-10-CM | POA: Diagnosis not present

## 2022-10-23 DIAGNOSIS — K649 Unspecified hemorrhoids: Secondary | ICD-10-CM | POA: Diagnosis not present

## 2022-10-23 HISTORY — DX: Sleep apnea, unspecified: G47.30

## 2022-10-23 HISTORY — PX: COLONOSCOPY WITH PROPOFOL: SHX5780

## 2022-10-23 HISTORY — PX: POLYPECTOMY: SHX5525

## 2022-10-23 LAB — POCT PREGNANCY, URINE: Preg Test, Ur: NEGATIVE

## 2022-10-23 SURGERY — COLONOSCOPY WITH PROPOFOL
Anesthesia: General

## 2022-10-23 MED ORDER — SODIUM CHLORIDE 0.9 % IV SOLN: INTRAVENOUS | Status: DC

## 2022-10-23 MED ORDER — LIDOCAINE HCL (CARDIAC) PF 100 MG/5ML IV SOSY
PREFILLED_SYRINGE | INTRAVENOUS | Status: DC | PRN
  Administered 2022-10-23: 20 mg via INTRAVENOUS

## 2022-10-23 MED ORDER — PROPOFOL 10 MG/ML IV BOLUS
INTRAVENOUS | Status: DC | PRN
  Administered 2022-10-23: 100 mg via INTRAVENOUS
  Administered 2022-10-23: 60 mg via INTRAVENOUS

## 2022-10-23 MED ORDER — PROPOFOL 500 MG/50ML IV EMUL
INTRAVENOUS | Status: DC | PRN
Start: 2022-10-23 — End: 2022-10-23
  Administered 2022-10-23: 125 ug/kg/min via INTRAVENOUS

## 2022-10-23 NOTE — Op Note (Signed)
[facility] Gastroenterology Patient Name: Tracy Stevens Procedure Date: 10/23/2022 8:47 AM MRN: [id number] Account #: [id number] Date of Birth: 1995-06-29 Admit Type: Outpatient Age: 27 Room: [facility] ENDO ROOM 1 Gender: Female Note Status: Finalized Instrument Name: Prentice Docker [id number] Procedure:             Colonoscopy Indications:           Colon cancer screening in patient at increased risk:                         Family history of 1st-degree relative with colon                         polyps before age 38 years Providers:             Eather Colas MD, MD Referring MD:          Eather Colas MD, MD (Referring MD) Medicines:             Monitored Anesthesia Care Complications:         No immediate complications. Estimated blood loss:                         Minimal. Procedure:             Pre-Anesthesia Assessment:                        - Prior to the procedure, a History and Physical was                         performed, and patient medications and allergies were                         reviewed. The patient is competent. The risks and                         benefits of the procedure and the sedation options and                         risks were discussed with the patient. All questions                         were answered and informed consent was obtained.                         Patient identification and proposed procedure were                         verified by the physician, the nurse, the                         anesthesiologist, the anesthetist and the technician                         in the endoscopy suite. Mental Status Examination:                         alert and oriented. Airway Examination: normal  oropharyngeal airway and neck mobility. Respiratory                         Examination: clear to auscultation. CV Examination:                         normal. Prophylactic Antibiotics: The patient does not                          require prophylactic antibiotics. Prior                         Anticoagulants: The patient has taken no anticoagulant                         or antiplatelet agents. ASA Grade Assessment: II - A                         patient with mild systemic disease. After reviewing                         the risks and benefits, the patient was deemed in                         satisfactory condition to undergo the procedure. The                         anesthesia plan was to use monitored anesthesia care                         (MAC). Immediately prior to administration of                         medications, the patient was re-assessed for adequacy                         to receive sedatives. The heart rate, respiratory                         rate, oxygen saturations, blood pressure, adequacy of                         pulmonary ventilation, and response to care were                         monitored throughout the procedure. The physical                         status of the patient was re-assessed after the                         procedure.                        After obtaining informed consent, the colonoscope was                         passed under direct vision. Throughout the procedure,  the patient's blood pressure, pulse, and oxygen                         saturations were monitored continuously. The                         Colonoscope was introduced through the anus and                         advanced to the the terminal ileum. The colonoscopy                         was performed without difficulty. The patient                         tolerated the procedure well. The quality of the bowel                         preparation was good. The terminal ileum, ileocecal                         valve, appendiceal orifice, and rectum were                         photographed. Findings:      The perianal and digital rectal examinations were normal.       The terminal ileum appeared normal.      An 8 mm polyp was found in the sigmoid colon. The polyp was sessile. The       polyp was removed with a cold snare. Resection and retrieval were       complete. Estimated blood loss was minimal.      Internal hemorrhoids were found during retroflexion. The hemorrhoids       were Grade I (internal hemorrhoids that do not prolapse).      The exam was otherwise without abnormality on direct and retroflexion       views. Impression:            - The examined portion of the ileum was normal.                        - One 8 mm polyp in the sigmoid colon, removed with a                         cold snare. Resected and retrieved.                        - Internal hemorrhoids.                        - The examination was otherwise normal on direct and                         retroflexion views. Recommendation:        - Discharge patient to home.                        - Resume previous diet.                        -  Continue present medications.                        - Await pathology results.                        - Repeat colonoscopy in 5 years for surveillance.                        - Return to referring physician as previously                         scheduled.                        - Refer to a genetics counselor at appointment to be                         scheduled. Premm score based on family history is >                         2.5% so warrants genetic testing for Lynch syndrome. Procedure Code(s):     --- Professional ---                        [phone], Colonoscopy, flexible; with removal of                         tumor(s), polyp(s), or other lesion(s) by snare                         technique Diagnosis Code(s):     --- Professional ---                        Z83.71, Family history of colonic polyps                        K64.0, First degree hemorrhoids                        D12.5, Benign neoplasm of sigmoid colon CPT copyright 2022  American Medical Association. All rights reserved. The codes documented in this report are preliminary and upon coder review may  be revised to meet current compliance requirements. Eather Colas MD, MD 10/23/2022 9:35:59 AM Number of Addenda: 0 Note Initiated On: 10/23/2022 8:47 AM Scope Withdrawal Time: 0 hours 8 minutes 15 seconds  Total Procedure Duration: 0 hours 15 minutes 34 seconds  Estimated Blood Loss:  Estimated blood loss was minimal.      [facility]

## 2022-10-23 NOTE — Transfer of Care (Signed)
Immediate Anesthesia Transfer of Care Note  Patient: Tracy Stevens  Procedure(s) Performed: COLONOSCOPY WITH PROPOFOL POLYPECTOMY  Patient Location: PACU  Anesthesia Type:General  Level of Consciousness: drowsy  Airway & Oxygen Therapy: Patient Spontanous Breathing  Post-op Assessment: Report given to RN and Post -op Vital signs reviewed and stable  Post vital signs: Reviewed and stable  Last Vitals:  Vitals Value Taken Time  BP 92/67 10/23/22 0924  Temp 36.1 C 10/23/22 0924  Pulse 83 10/23/22 0925  Resp 15   SpO2 99 % 10/23/22 0925  Vitals shown include unfiled device data.  Last Pain:  Vitals:   10/23/22 0924  TempSrc: Temporal  PainSc: Asleep         Complications: No notable events documented.

## 2022-10-23 NOTE — Anesthesia Preprocedure Evaluation (Signed)
Anesthesia Evaluation  Patient identified by MRN, date of birth, ID band Patient awake    Reviewed: Allergy & Precautions, NPO status , Patient's Chart, lab work & pertinent test results  Airway Mallampati: II  TM Distance: >3 FB Neck ROM: Full    Dental  (+) Teeth Intact   Pulmonary neg pulmonary ROS, sleep apnea    Pulmonary exam normal        Cardiovascular Exercise Tolerance: Good negative cardio ROS Normal cardiovascular exam Rhythm:Regular Rate:Normal     Neuro/Psych  Headaches negative neurological ROS  negative psych ROS   GI/Hepatic negative GI ROS, Neg liver ROS,,,  Endo/Other  negative endocrine ROS  Morbid obesity  Renal/GU negative Renal ROS  negative genitourinary   Musculoskeletal   Abdominal  (+) + obese  Peds negative pediatric ROS (+)  Hematology negative hematology ROS (+)   Anesthesia Other Findings Past Medical History: No date: Congenital enlarged kidney No date: Headache No date: Sleep apnea  Past Surgical History: No date: NASAL SINUS SURGERY  BMI    Body Mass Index: 36.44 kg/m      Reproductive/Obstetrics negative OB ROS                             Anesthesia Physical Anesthesia Plan  ASA: 2  Anesthesia Plan: General   Post-op Pain Management:    Induction: Intravenous  PONV Risk Score and Plan: Propofol infusion and TIVA  Airway Management Planned: Natural Airway and Nasal Cannula  Additional Equipment:   Intra-op Plan:   Post-operative Plan:   Informed Consent: I have reviewed the patients History and Physical, chart, labs and discussed the procedure including the risks, benefits and alternatives for the proposed anesthesia with the patient or authorized representative who has indicated his/her understanding and acceptance.     Dental Advisory Given  Plan Discussed with: CRNA and Surgeon  Anesthesia Plan Comments:         Anesthesia Quick Evaluation

## 2022-10-23 NOTE — H&P (Signed)
Outpatient short stay form Pre-procedure 10/23/2022  Regis Bill, MD  Primary Physician: [facility]  Reason for visit:  Screening  History of present illness:    27 y/o lady with history of sleep apnea here for colonoscopy due to parents with colon polyps at young age and grandparents with colon cancer at early age. No blood thinners. No significant abdominal surgeries.     Current Facility-Administered Medications:    0.9 %  sodium chloride infusion, , Intravenous, Continuous, Cherrill Scrima, Rossie Muskrat, MD, Last Rate: 10 mL/hr at 10/23/22 0841, New Bag at 10/23/22 0841  Medications Prior to Admission  Medication Sig Dispense Refill Last Dose   acetaminophen (TYLENOL) 325 MG tablet Take 650 mg by mouth every 6 (six) hours as needed.      amoxicillin-clavulanate (AUGMENTIN) 875-125 MG tablet Take 1 tablet by mouth 2 (two) times daily. (Patient not taking: Reported on 10/16/2022) 14 tablet 0 Not Taking   amoxicillin-clavulanate (AUGMENTIN) 875-125 MG tablet Take 1 tablet (875 mg total) by mouth every 12 (twelve) hours for 7 days (Patient not taking: Reported on 10/16/2022) 14 tablet 0 Not Taking   azelastine (ASTELIN) 0.1 % nasal spray Place 1 spray into both nostrils 2 (two) times daily. 30 mL 11    azithromycin (ZITHROMAX) 250 MG tablet Take 2 tablets by mouth on day one, then 1 tablet daily for 4 days (Patient not taking: Reported on 10/16/2022) 6 tablet 0 Not Taking   benzonatate (TESSALON) 200 MG capsule Take 1 capsule (200 mg total) by mouth 3 (three) times daily as needed for Cough for up to 7 days (Patient not taking: Reported on 10/16/2022) 20 capsule 0 Not Taking   clobetasol (TEMOVATE) 0.05 % external solution Apply 1 application to dry scalp daily as needed for symptoms. 50 mL 3    cyclobenzaprine (FLEXERIL) 5 MG tablet Take 1 tablet (5 mg total) by mouth at bedtime as needed for Muscle spasms for up to 10 days (Patient not taking: Reported on 10/16/2022) 30 tablet 0 Not  Taking   cyclobenzaprine (FLEXERIL) 5 MG tablet Take 1 tablet (5 mg total) by mouth at bedtime as needed for muscle spasms for up to 10 days (Patient not taking: Reported on 10/16/2022) 30 tablet 4 Not Taking   cyclobenzaprine (FLEXERIL) 5 MG tablet Take 1 tablet (5 mg total) by mouth daily as needed. 30 tablet 2    fluticasone (FLONASE) 50 MCG/ACT nasal spray Place 2 sprays into both nostrils daily. 16 g 0    ibuprofen (ADVIL) 600 MG tablet Take 1 tablet (600 mg total) by mouth every 6 (six) hours. 30 tablet 0    montelukast (SINGULAIR) 10 MG tablet Take 1 tablet (10 mg total) by mouth at bedtime 30 tablet 11    Na Sulfate-K Sulfate-Mg Sulf 17.5-3.13-1.6 GM/177ML SOLN Take 1 Bottle by mouth as directed One kit contains 2 bottles.  Take both bottles at the times instructed by your provider. 354 mL 0    phentermine (ADIPEX-P) 37.5 MG tablet Take 1 tablet (37.5 mg total) by mouth every morning before breakfast. 30 tablet 0    Prenatal Vit-Fe Fumarate-FA (PRENATAL MULTIVITAMIN) TABS tablet Take 1 tablet by mouth daily at 12 noon.      propranolol (INDERAL) 10 MG tablet Take 1 tablet (10 mg total) by mouth 2 (two) times daily. (Patient not taking: Reported on 10/16/2022) 60 tablet 11 Not Taking   propranolol (INDERAL) 10 MG tablet Take 1 tablet (10 mg total) by mouth 2 (  two) times daily. 180 tablet 1 10/21/2022   sertraline (ZOLOFT) 25 MG tablet Take 1 tablet (25 mg total) by mouth daily. (Patient not taking: Reported on 10/16/2022) 30 tablet 6 Not Taking   sertraline (ZOLOFT) 50 MG tablet Take 1.5 tablets (75 mg total) by mouth daily. 135 tablet 1    SUMAtriptan (IMITREX) 25 MG tablet Take 1 tablet (25 mg total) by mouth as directed for Migraine.  May take a second dose after 2 hours if needed. 9 tablet 0    venlafaxine XR (EFFEXOR-XR) 75 MG 24 hr capsule Take 1 capsule (75 mg total) by mouth once daily 30 capsule 1    Vitamin D, Ergocalciferol, (DRISDOL) 1.25 MG (50000 UNIT) CAPS capsule Take 1 capsule  (50,000 Units total) by mouth once a week. 8 capsule 0      No Known Allergies   Past Medical History:  Diagnosis Date   Congenital enlarged kidney    Headache    Sleep apnea     Review of systems:  Otherwise negative.    Physical Exam  Gen: Alert, oriented. Appears stated age.  HEENT: PERRLA. Lungs: No respiratory distress CV: RRR Abd: soft, benign, no masses Ext: No edema    Planned procedures: Proceed with colonoscopy. The patient understands the nature of the planned procedure, indications, risks, alternatives and potential complications including but not limited to bleeding, infection, perforation, damage to internal organs and possible oversedation/side effects from anesthesia. The patient agrees and gives consent to proceed.  Please refer to procedure notes for findings, recommendations and patient disposition/instructions.     Regis Bill, MD [facility]

## 2022-10-23 NOTE — Interval H&P Note (Signed)
History and Physical Interval Note:  10/23/2022 8:57 AM  Tracy Stevens  has presented today for surgery, with the diagnosis of V18.51 (ICD-9-CM) - Z83.719 (ICD-10-CM) - Family history of colonic polyps.  The various methods of treatment have been discussed with the patient and family. After consideration of risks, benefits and other options for treatment, the patient has consented to  Procedure(s): COLONOSCOPY WITH PROPOFOL (N/A) as a surgical intervention.  The patient's history has been reviewed, patient examined, no change in status, stable for surgery.  I have reviewed the patient's chart and labs.  Questions were answered to the patient's satisfaction.     Regis Bill  Ok to proceed with colonoscopy

## 2022-10-23 NOTE — Anesthesia Postprocedure Evaluation (Signed)
Anesthesia Post Note  Patient: Tracy Stevens  Procedure(s) Performed: COLONOSCOPY WITH PROPOFOL POLYPECTOMY  Patient location during evaluation: PACU Anesthesia Type: General Level of consciousness: awake Pain management: satisfactory to patient Respiratory status: spontaneous breathing Cardiovascular status: stable Anesthetic complications: no   No notable events documented.   Last Vitals:  Vitals:   10/23/22 0924 10/23/22 0934  BP: 92/67 (!) 98/50  Pulse:    Resp:    Temp: (!) 36.1 C   SpO2:  100%    Last Pain:  Vitals:   10/23/22 0944  TempSrc:   PainSc: 0-No pain                 VAN STAVEREN,Royston Bekele

## 2022-10-24 ENCOUNTER — Encounter: Payer: Self-pay | Admitting: Gastroenterology

## 2022-10-24 ENCOUNTER — Other Ambulatory Visit: Payer: Self-pay

## 2022-10-24 LAB — SURGICAL PATHOLOGY

## 2022-10-31 ENCOUNTER — Other Ambulatory Visit: Payer: Self-pay | Admitting: Family Medicine

## 2022-10-31 DIAGNOSIS — N839 Noninflammatory disorder of ovary, fallopian tube and broad ligament, unspecified: Secondary | ICD-10-CM

## 2022-11-01 ENCOUNTER — Telehealth: Payer: 59 | Admitting: Physician Assistant

## 2022-11-01 ENCOUNTER — Ambulatory Visit
Admission: EM | Admit: 2022-11-01 | Discharge: 2022-11-01 | Disposition: A | Discharge status: Home / Self Care | Payer: 59 | Attending: Emergency Medicine | Admitting: Emergency Medicine

## 2022-11-01 ENCOUNTER — Ambulatory Visit
Admission: RE | Admit: 2022-11-01 | Discharge: 2022-11-01 | Disposition: A | Discharge status: Home / Self Care | Payer: 59 | Source: Ambulatory Visit | Attending: Family Medicine | Admitting: Family Medicine

## 2022-11-01 DIAGNOSIS — R079 Chest pain, unspecified: Secondary | ICD-10-CM

## 2022-11-01 DIAGNOSIS — B9789 Other viral agents as the cause of diseases classified elsewhere: Secondary | ICD-10-CM | POA: Insufficient documentation

## 2022-11-01 DIAGNOSIS — J069 Acute upper respiratory infection, unspecified: Secondary | ICD-10-CM | POA: Insufficient documentation

## 2022-11-01 DIAGNOSIS — N83202 Unspecified ovarian cyst, left side: Secondary | ICD-10-CM | POA: Diagnosis not present

## 2022-11-01 DIAGNOSIS — R059 Cough, unspecified: Secondary | ICD-10-CM | POA: Insufficient documentation

## 2022-11-01 DIAGNOSIS — N839 Noninflammatory disorder of ovary, fallopian tube and broad ligament, unspecified: Secondary | ICD-10-CM | POA: Insufficient documentation

## 2022-11-01 DIAGNOSIS — R6889 Other general symptoms and signs: Secondary | ICD-10-CM

## 2022-11-01 DIAGNOSIS — Z1152 Encounter for screening for COVID-19: Secondary | ICD-10-CM | POA: Diagnosis not present

## 2022-11-01 HISTORY — DX: Nonscarring hair loss, unspecified: L65.9

## 2022-11-01 MED ORDER — PROMETHAZINE-DM 6.25-15 MG/5ML PO SYRP
5.0000 mL | ORAL_SOLUTION | Freq: Every evening | ORAL | 0 refills | Status: AC | PRN
  Filled 2022-11-01 – 2022-11-02 (×2): qty 118, 23d supply, fill #0

## 2022-11-01 MED ORDER — PREDNISONE 20 MG PO TABS
40.0000 mg | ORAL_TABLET | Freq: Every day | ORAL | 0 refills | Status: AC
  Filled 2022-11-01: qty 10, 5d supply, fill #0

## 2022-11-01 MED ORDER — BENZONATATE 100 MG PO CAPS
100.0000 mg | ORAL_CAPSULE | Freq: Three times a day (TID) | ORAL | 0 refills | Status: AC
  Filled 2022-11-01: qty 21, 7d supply, fill #0

## 2022-11-01 NOTE — ED Triage Notes (Signed)
Patient to Urgent Care with complaints of sore throat/ cough/ sinus pain/ drainage and chest tightness. Upper back soreness/ pressure. States she has been hot and sweaty. No fevers.   Reports symptoms started Sunday.   Has been taking Mucinex/ ibuprofen

## 2022-11-01 NOTE — Progress Notes (Signed)
Because of mention of constant pain in chest/abdomen along with these other symptoms, I feel your condition warrants further evaluation and I recommend that you be seen in a face to face visit.   NOTE: There will be NO CHARGE for this eVisit   If you are having a true medical emergency please call 911.      For an urgent face to face visit, [place] has eight urgent care centers for your convenience:   NEW!! [facility] at [facility] Get Driving Directions [phone] [address] [place], [address]    [facility] Urgent Care Center at [facility] Get Driving Directions [phone] [address] [place], [place] [address]   [facility] Health Urgent Care Center [facility]) Get Driving Directions [phone] [address] [place], [place] [address]  [facility] Urgent Care Center [facility]) Get Driving Directions [phone] [address] [place],  [place]  [address]  [facility] Center [facility] - at [employer]  [phone] [address] [place],  [place] [address]   [facility] Health Urgent Care at [facility] Get Driving Directions [phone] [address] [place], [place] [address]   [facility] Health Urgent Care at [facility] Get Driving Directions  [phone] [address] [place], [place] [address]   [facility] Health Urgent Care at [facility] Directions [phone] [address] [place], [place] [address]  Your MyChart E-visit questionnaire answers were reviewed by a board certified advanced clinical practitioner to complete your personal care plan based on your specific symptoms.  Thank you for using e-Visits.

## 2022-11-01 NOTE — ED Provider Notes (Signed)
Renaldo Fiddler    CSN: [id number] Arrival date & time: 11/01/22  1819      History   Chief Complaint Chief Complaint  Patient presents with   URI    HPI Tracy Stevens is a 27 y.o. female.   Patient presents for evaluation of of nasal congestion, rhinorrhea, bilateral ear popping, intermittent generalized headaches, sore throat and hoarseness, and a nonproductive cough present for 4 to 5 days.  Experiencing generalized chest soreness and upper back pain from persistent coughing.  Symptoms worsening at nighttime interfering with sleep.  Has attempted use of Mucinex which has provided minimal relief.  Denies respiratory history, non-smoker.  No known sick contacts also with similar symptoms.  Past Medical History:  Diagnosis Date   Alopecia    Congenital enlarged kidney    Headache    Sleep apnea     Patient Active Problem List   Diagnosis Date Noted   Urge incontinence of urine 10/30/2018   Low back pain 09/30/2018   BMI 35.0-35.9,adult 09/30/2018   Family history of breast cancer 09/30/2018   Intractable migraine with aura with status migrainosus 11/08/2015    Past Surgical History:  Procedure Laterality Date   COLONOSCOPY WITH PROPOFOL N/A 10/23/2022   Procedure: COLONOSCOPY WITH PROPOFOL;  Surgeon: Regis Bill, MD;  Location: [facility] ENDOSCOPY;  Service: Endoscopy;  Laterality: N/A;   NASAL SINUS SURGERY     POLYPECTOMY  10/23/2022   Procedure: POLYPECTOMY;  Surgeon: Regis Bill, MD;  Location: [facility] ENDOSCOPY;  Service: Endoscopy;;    OB History     Gravida  2   Para  2   Term  2   Preterm      AB      Living  2      SAB      IAB      Ectopic      Multiple  0   Live Births  2            Home Medications    Prior to Admission medications   Medication Sig Start Date End Date Taking? Authorizing Provider  benzonatate (TESSALON) 100 MG capsule Take 1 capsule (100 mg total) by mouth every 8 (eight) hours.  11/01/22  Yes Keileigh Vahey R, NP  predniSONE (DELTASONE) 20 MG tablet Take 2 tablets (40 mg total) by mouth daily. 11/01/22  Yes Tracye Szuch R, NP  promethazine-dextromethorphan (PROMETHAZINE-DM) 6.25-15 MG/5ML syrup Take 5 mLs by mouth at bedtime as needed for cough. 11/01/22  Yes Valinda Hoar, NP  Timmothy Sours 93 MCG/ACT EXHU USE 1 SPRAY TWICE A DAY 07/25/22  Yes [provider]  acetaminophen (TYLENOL) 325 MG tablet Take 650 mg by mouth every 6 (six) hours as needed.    [provider]  amoxicillin-clavulanate (AUGMENTIN) 875-125 MG tablet Take 1 tablet by mouth 2 (two) times daily. Patient not taking: Reported on 10/16/2022 09/20/21   Waldon Merl, PA-C  amoxicillin-clavulanate (AUGMENTIN) 875-125 MG tablet Take 1 tablet (875 mg total) by mouth every 12 (twelve) hours for 7 days Patient not taking: Reported on 10/16/2022 05/10/22     azelastine (ASTELIN) 0.1 % nasal spray Place 1 spray into both nostrils 2 (two) times daily. Patient not taking: Reported on 11/01/2022 05/04/22     azithromycin (ZITHROMAX) 250 MG tablet Take 2 tablets by mouth on day one, then 1 tablet daily for 4 days Patient not taking: Reported on 10/16/2022 01/24/22     clobetasol (TEMOVATE) 0.05 %  external solution Apply 1 application to dry scalp daily as needed for symptoms. 10/01/22     cyclobenzaprine (FLEXERIL) 5 MG tablet Take 1 tablet (5 mg total) by mouth at bedtime as needed for Muscle spasms for up to 10 days Patient not taking: Reported on 10/16/2022 01/24/22     cyclobenzaprine (FLEXERIL) 5 MG tablet Take 1 tablet (5 mg total) by mouth at bedtime as needed for muscle spasms for up to 10 days Patient not taking: Reported on 10/16/2022 02/23/22     cyclobenzaprine (FLEXERIL) 5 MG tablet Take 1 tablet (5 mg total) by mouth daily as needed. 07/30/22     fluticasone (FLONASE) 50 MCG/ACT nasal spray Place 2 sprays into both nostrils daily. Patient not taking: Reported on 11/01/2022 03/22/21   Couture,  Cortni S, PA-C  ibuprofen (ADVIL) 600 MG tablet Take 1 tablet (600 mg total) by mouth every 6 (six) hours. 06/29/20   Doreene Burke, CNM  montelukast (SINGULAIR) 10 MG tablet Take 1 tablet (10 mg total) by mouth at bedtime Patient not taking: Reported on 11/01/2022 03/13/22     Na Sulfate-K Sulfate-Mg Sulf 17.5-3.13-1.6 GM/177ML SOLN Take 1 Bottle by mouth as directed One kit contains 2 bottles.  Take both bottles at the times instructed by your provider. 10/17/22     phentermine (ADIPEX-P) 37.5 MG tablet Take 1 tablet (37.5 mg total) by mouth every morning before breakfast. 03/13/22     Prenatal Vit-Fe Fumarate-FA (PRENATAL MULTIVITAMIN) TABS tablet Take 1 tablet by mouth daily at 12 noon.    [provider]  propranolol (INDERAL) 10 MG tablet Take 1 tablet (10 mg total) by mouth 2 (two) times daily. Patient not taking: Reported on 10/16/2022 04/27/22     propranolol (INDERAL) 10 MG tablet Take 1 tablet (10 mg total) by mouth 2 (two) times daily. 07/30/22     sertraline (ZOLOFT) 25 MG tablet Take 1 tablet (25 mg total) by mouth daily. Patient not taking: Reported on 10/16/2022 11/23/20   Doreene Burke, CNM  sertraline (ZOLOFT) 50 MG tablet Take 1.5 tablets (75 mg total) by mouth daily. 07/30/22   Fields, Lisabeth Pick, NP  SUMAtriptan (IMITREX) 25 MG tablet Take 1 tablet (25 mg total) by mouth as directed for Migraine.  May take a second dose after 2 hours if needed. 10/02/22     venlafaxine XR (EFFEXOR-XR) 75 MG 24 hr capsule Take 1 capsule (75 mg total) by mouth once daily 10/12/22     Vitamin D, Ergocalciferol, (DRISDOL) 1.25 MG (50000 UNIT) CAPS capsule Take 1 capsule (50,000 Units total) by mouth once a week. 07/03/22   Fields, Lisabeth Pick, NP  triamcinolone (NASACORT) 55 MCG/ACT AERO nasal inhaler Place 2 sprays into both nostrils daily. 05/04/22 07/30/22      Family History Family History  Problem Relation Age of Onset   Stroke Mother    Multiple sclerosis Mother    Diabetes Maternal  Grandmother    Cancer Maternal Grandmother    Diabetes Maternal Grandfather    Diabetes Paternal Grandfather    Heart disease Paternal Grandfather    Asthma Sister    Epilepsy Sister     Social History Social History   Tobacco Use   Smoking status: Never   Smokeless tobacco: Never  Vaping Use   Vaping status: Never Used  Substance Use Topics   Alcohol use: No   Drug use: Never     Allergies   Patient has no known allergies.   Review of Systems Review of  Systems   Physical Exam Triage Vital Signs ED Triage Vitals  Encounter Vitals Group     BP 11/01/22 1842 123/79     Systolic BP Percentile --      Diastolic BP Percentile --      Pulse Rate 11/01/22 1842 80     Resp 11/01/22 1842 18     Temp 11/01/22 1842 98.8 F (37.1 C)     Temp src --      SpO2 11/01/22 1842 98 %     Weight --      Height --      Head Circumference --      Peak Flow --      Pain Score 11/01/22 1837 3     Pain Loc --      Pain Education --      Exclude from Growth Chart --    No data found.  Updated Vital Signs BP 123/79   Pulse 80   Temp 98.8 F (37.1 C)   Resp 18   SpO2 98%   Visual Acuity Right Eye Distance:   Left Eye Distance:   Bilateral Distance:    Right Eye Near:   Left Eye Near:    Bilateral Near:     Physical Exam Constitutional:      Appearance: Normal appearance.  HENT:     Head: Normocephalic.     Right Ear: Tympanic membrane, ear canal and external ear normal.     Left Ear: Tympanic membrane, ear canal and external ear normal.     Nose: Congestion and rhinorrhea present.     Mouth/Throat:     Pharynx: Posterior oropharyngeal erythema present. No oropharyngeal exudate.  Eyes:     Extraocular Movements: Extraocular movements intact.  Cardiovascular:     Rate and Rhythm: Normal rate and regular rhythm.     Pulses: Normal pulses.     Heart sounds: Normal heart sounds.  Pulmonary:     Effort: Pulmonary effort is normal.     Breath sounds: Normal  breath sounds.  Musculoskeletal:     Cervical back: Normal range of motion and neck supple.  Skin:    General: Skin is warm and dry.  Neurological:     Mental Status: She is alert and oriented to person, place, and time. Mental status is at baseline.      UC Treatments / Results  Labs (all labs ordered are listed, but only abnormal results are displayed) Labs Reviewed  SARS CORONAVIRUS 2 (TAT 6-24 HRS)    EKG   Radiology No results found.  Procedures Procedures (including critical care time)  Medications Ordered in UC Medications - No data to display  Initial Impression / Assessment and Plan / UC Course  I have reviewed the triage vital signs and the nursing notes.  Pertinent labs & imaging results that were available during my care of the patient were reviewed by me and considered in my medical decision making (see chart for details).  Viral URI with cough  Patient is in no signs of distress nor toxic appearing.  Vital signs are stable.  Low suspicion for pneumonia, pneumothorax or bronchitis and therefore will defer imaging.COVID test is pending, reviewed quarantine guidelines per CDC recommendations.  The adult with minimal comorbidity, does not qualify for antiviral.  Prescribed prednisone, Tessalon and Promethazine DM.May use additional over-the-counter medications as needed for supportive care.  May follow-up with urgent care as needed if symptoms persist or worsen.  Final Clinical Impressions(s) /  UC Diagnoses   Final diagnoses:  Viral URI with cough     Discharge Instructions      Your symptoms today are most likely being caused by a virus and should steadily improve in time it can take up to 7 to 10 days before you truly start to see a turnaround however things will get better  COVID test pending up to 24 hours, you will be notified of positive test results only, if positive you need to quarantine only if having fever, if no fever may continue activity with  mask  Staring tomorrow take prednisone every morning with food for 5 days to open and relax the airway to make it easier for you to breathe  You may use Tessalon pill every 8 hours as needed to help calm your coughing  You may use cough syrup at bedtime as needed for additional comfort, be mindful this will make you feel sleepy    You can take Tylenol and/or Ibuprofen as needed for fever reduction and pain relief.   For cough: honey 1/2 to 1 teaspoon (you can dilute the honey in water or another fluid).  You can also use guaifenesin and dextromethorphan for cough. You can use a humidifier for chest congestion and cough.  If you don't have a humidifier, you can sit in the bathroom with the hot shower running.      For sore throat: try warm salt water gargles, cepacol lozenges, throat spray, warm tea or water with lemon/honey, popsicles or ice, or OTC cold relief medicine for throat discomfort.   For congestion: take a daily anti-histamine like Zyrtec, Claritin, and a oral decongestant, such as pseudoephedrine.  You can also use Flonase 1-2 sprays in each nostril daily.   It is important to stay hydrated: drink plenty of fluids (water, gatorade/powerade/pedialyte, juices, or teas) to keep your throat moisturized and help further relieve irritation/discomfort.    ED Prescriptions     Medication Sig Dispense Auth. Provider   benzonatate (TESSALON) 100 MG capsule Take 1 capsule (100 mg total) by mouth every 8 (eight) hours. 21 capsule Aiva Miskell R, NP   promethazine-dextromethorphan (PROMETHAZINE-DM) 6.25-15 MG/5ML syrup Take 5 mLs by mouth at bedtime as needed for cough. 118 mL Taiquan Campanaro R, NP   predniSONE (DELTASONE) 20 MG tablet Take 2 tablets (40 mg total) by mouth daily. 10 tablet Valinda Hoar, NP      PDMP not reviewed this encounter.   Valinda Hoar, NP 11/01/22 1911

## 2022-11-01 NOTE — Discharge Instructions (Signed)
Your symptoms today are most likely being caused by a virus and should steadily improve in time it can take up to 7 to 10 days before you truly start to see a turnaround however things will get better  COVID test pending up to 24 hours, you will be notified of positive test results only, if positive you need to quarantine only if having fever, if no fever may continue activity with mask  Staring tomorrow take prednisone every morning with food for 5 days to open and relax the airway to make it easier for you to breathe  You may use Tessalon pill every 8 hours as needed to help calm your coughing  You may use cough syrup at bedtime as needed for additional comfort, be mindful this will make you feel sleepy    You can take Tylenol and/or Ibuprofen as needed for fever reduction and pain relief.   For cough: honey 1/2 to 1 teaspoon (you can dilute the honey in water or another fluid).  You can also use guaifenesin and dextromethorphan for cough. You can use a humidifier for chest congestion and cough.  If you don't have a humidifier, you can sit in the bathroom with the hot shower running.      For sore throat: try warm salt water gargles, cepacol lozenges, throat spray, warm tea or water with lemon/honey, popsicles or ice, or OTC cold relief medicine for throat discomfort.   For congestion: take a daily anti-histamine like Zyrtec, Claritin, and a oral decongestant, such as pseudoephedrine.  You can also use Flonase 1-2 sprays in each nostril daily.   It is important to stay hydrated: drink plenty of fluids (water, gatorade/powerade/pedialyte, juices, or teas) to keep your throat moisturized and help further relieve irritation/discomfort.

## 2022-11-02 ENCOUNTER — Other Ambulatory Visit: Payer: Self-pay

## 2022-11-02 LAB — SARS CORONAVIRUS 2 (TAT 6-24 HRS): SARS Coronavirus 2: NEGATIVE

## 2022-11-05 NOTE — Plan of Care (Signed)
[facility] Tonsillectomy/Adenoidectomy, Postoperative PEDS care plan entered in error.

## 2022-11-12 DIAGNOSIS — L2989 Other pruritus: Secondary | ICD-10-CM | POA: Diagnosis not present

## 2022-11-12 DIAGNOSIS — L638 Other alopecia areata: Secondary | ICD-10-CM | POA: Diagnosis not present

## 2022-11-30 ENCOUNTER — Other Ambulatory Visit: Payer: Self-pay

## 2022-11-30 DIAGNOSIS — L659 Nonscarring hair loss, unspecified: Secondary | ICD-10-CM | POA: Diagnosis not present

## 2022-11-30 DIAGNOSIS — E559 Vitamin D deficiency, unspecified: Secondary | ICD-10-CM | POA: Diagnosis not present

## 2022-11-30 DIAGNOSIS — F330 Major depressive disorder, recurrent, mild: Secondary | ICD-10-CM | POA: Diagnosis not present

## 2022-11-30 DIAGNOSIS — Z23 Encounter for immunization: Secondary | ICD-10-CM | POA: Diagnosis not present

## 2022-11-30 DIAGNOSIS — F411 Generalized anxiety disorder: Secondary | ICD-10-CM | POA: Diagnosis not present

## 2022-11-30 DIAGNOSIS — R5383 Other fatigue: Secondary | ICD-10-CM | POA: Diagnosis not present

## 2022-11-30 DIAGNOSIS — G4733 Obstructive sleep apnea (adult) (pediatric): Secondary | ICD-10-CM | POA: Diagnosis not present

## 2022-11-30 DIAGNOSIS — Z6837 Body mass index (BMI) 37.0-37.9, adult: Secondary | ICD-10-CM | POA: Diagnosis not present

## 2022-11-30 DIAGNOSIS — G250 Essential tremor: Secondary | ICD-10-CM | POA: Diagnosis not present

## 2022-11-30 DIAGNOSIS — G43111 Migraine with aura, intractable, with status migrainosus: Secondary | ICD-10-CM | POA: Diagnosis not present

## 2022-11-30 DIAGNOSIS — G5601 Carpal tunnel syndrome, right upper limb: Secondary | ICD-10-CM | POA: Diagnosis not present

## 2022-11-30 MED ORDER — VENLAFAXINE HCL ER 75 MG PO CP24
75.0000 mg | ORAL_CAPSULE | Freq: Every day | ORAL | 1 refills | Status: AC
  Filled 2022-12-31: qty 90, 90d supply, fill #0
  Filled 2023-04-04: qty 90, 90d supply, fill #1

## 2022-11-30 MED ORDER — SERTRALINE HCL 50 MG PO TABS
75.0000 mg | ORAL_TABLET | Freq: Every day | ORAL | 1 refills | Status: DC
  Filled 2022-12-31 – 2023-02-11 (×2): qty 135, 90d supply, fill #0
  Filled 2023-04-29 – 2023-05-14 (×3): qty 135, 90d supply, fill #1

## 2022-12-03 ENCOUNTER — Other Ambulatory Visit: Payer: Self-pay

## 2022-12-03 MED ORDER — ERGOCALCIFEROL 1.25 MG (50000 UT) PO CAPS
50000.0000 [IU] | ORAL_CAPSULE | ORAL | 1 refills | Status: AC
  Filled 2022-12-03: qty 12, 84d supply, fill #0

## 2022-12-04 ENCOUNTER — Other Ambulatory Visit: Payer: Self-pay

## 2022-12-05 ENCOUNTER — Other Ambulatory Visit: Payer: Self-pay

## 2022-12-05 NOTE — Progress Notes (Unsigned)
REFERRING PROVIDER: Ardelia Mems, PA-C [address] [place],  [place] [address]  PRIMARY PROVIDER:  Alm Bustard, NP  PRIMARY REASON FOR VISIT:  1. Family history of colon cancer   2. Family history of breast cancer   3. Family history of prostate cancer     HISTORY OF PRESENT ILLNESS:   Tracy Stevens, a 27 y.o. female, was seen for a [place] cancer genetics consultation at the request of Dr. [name] due to a family history of cancer.  Tracy Stevens presents to clinic today to discuss the possibility of a hereditary predisposition to cancer, genetic testing, and to further clarify her future cancer risks, as well as potential cancer risks for family members.    CANCER HISTORY:  Tracy Stevens is a 27 y.o. female with no personal history of cancer.    RISK FACTORS:  Menarche was at age 39.  First live birth at age 57.  Ovaries intact: yes.  Hysterectomy: no.  Menopausal status: premenopausal.  Colonoscopy: yes;  1 TA . Mammogram within the last year: no. - had one when she was younger, has dense breast tissue Number of breast biopsies: 0.   Past Medical History:  Diagnosis Date   Alopecia    Congenital enlarged kidney    Headache    Sleep apnea     Past Surgical History:  Procedure Laterality Date   COLONOSCOPY WITH PROPOFOL N/A 10/23/2022   Procedure: COLONOSCOPY WITH PROPOFOL;  Surgeon: Regis Bill, MD;  Location: [facility] ENDOSCOPY;  Service: Endoscopy;  Laterality: N/A;   NASAL SINUS SURGERY     POLYPECTOMY  10/23/2022   Procedure: POLYPECTOMY;  Surgeon: Regis Bill, MD;  Location: [facility] ENDOSCOPY;  Service: Endoscopy;;    FAMILY HISTORY:  We obtained a detailed, 4-generation family history.  Significant diagnoses are listed below: Family History  Problem Relation Age of Onset   Stroke Mother    Multiple sclerosis Mother    Colon polyps Father    Asthma Sister    Epilepsy Sister    Diabetes Maternal Grandmother     Breast cancer Maternal Grandmother        dx late 40s-early 22s   Diabetes Maternal Grandfather    Prostate cancer Maternal Grandfather    Colon cancer Paternal Grandmother        d. 52s   Diabetes Paternal Grandfather    Heart disease Paternal Grandfather    Breast cancer Other        grandfather's sister   Breast cancer Maternal Great-grandmother        grandmother's mom   Colon cancer Maternal Great-grandmother        grandfather's mom   Tracy Stevens has 2 daughters, ages 70 and 10. She has 1 full sister. She has a maternal half sister whose father is patient's paternal uncle. She has another maternal half sister who has a different father, no cancers.  Tracy Stevens mother is living at 46, she has had colon polyps, unsure total number. Maternal grandmother had breast cancer in her late 45s-early 59s and passed at 73 of a different cause. Her mother had breast cancer as well. Maternal grandfather is living and has had prostate cancer. He has a sister who had breast cancer and his mother had colon cancer.  Tracy Stevens father is living at 6 and has had colon polyps, unsure total number. He gets colonoscopies every 3 years. Paternal grandmother had colon cancer and passed from it in her 69s.  Paternal grandfather is living in his 54s.  Tracy Stevens is unaware of previous family history of genetic testing for hereditary cancer risks. There is no reported Ashkenazi Jewish ancestry. There is no known consanguinity.    GENETIC COUNSELING ASSESSMENT: Tracy Stevens is a 28 y.o. female with a family history of cancer which is somewhat suggestive of a hereditary cancer syndrome and predisposition to cancer. We, therefore, discussed and recommended the following at today's visit.   DISCUSSION: We discussed that approximately 10% of colon cancer is hereditary. Most cases of hereditary colon cancer are associated with Lynch syndrome genes, although there are other genes associated with hereditary cancer  as well, including the BRCA genes which can increase breast/prostate cancer risk. Cancers and risks are gene specific. We discussed that testing is beneficial for several reasons including knowing about cancer risks, identifying potential screening and risk-reduction options that may be appropriate, and to understand if other family members could be at risk for cancer and allow them to undergo genetic testing.   We reviewed the characteristics, features and inheritance patterns of hereditary cancer syndromes. We also discussed genetic testing, including the appropriate family members to test, the process of testing, insurance coverage and turn-around-time for results. We discussed the implications of a negative, positive and/or variant of uncertain significant result. We recommended Tracy Stevens pursue genetic testing for the [facility] CancerNext-Expanded+RNA gene panel.   Based on Tracy Stevens's family history of cancer, she meets medical criteria for genetic testing. Despite that she meets criteria, she may still have an out of pocket cost.   We discussed that some people do not want to undergo genetic testing due to fear of genetic discrimination.  A federal law called the Genetic Information Non-Discrimination Act (GINA) of 2008 helps protect individuals against genetic discrimination based on their genetic test results.  It impacts both health insurance and employment.  For health insurance, it protects against increased premiums, being kicked off insurance or being forced to take a test in order to be insured.  For employment it protects against hiring, firing and promoting decisions based on genetic test results.  Health status due to a cancer diagnosis is not protected under GINA.  This law does not protect life insurance, disability insurance, or other types of insurance.   PLAN: After considering the risks, benefits, and limitations, Tracy Stevens provided informed consent to pursue genetic testing and the  blood sample was sent to [facility] for analysis of the CancerNext-Expanded+RNA panel. Results should be available within approximately 2-3 weeks' time, at which point they will be disclosed by telephone to Tracy Stevens, as will any additional recommendations warranted by these results. Tracy Stevens will receive a summary of her genetic counseling visit and a copy of her results once available. This information will also be available in Epic.   Tracy Stevens questions were answered to her satisfaction today. Our contact information was provided should additional questions or concerns arise. Thank you for the referral and allowing [place] to share in the care of your patient.   Lacy Duverney, MS, [facility] Genetic Counselor [email or web address] Phone: [phone]  The patient was seen for a total of 25 minutes in face-to-face genetic counseling.  Dr. Blake Divine was available for discussion regarding this case.   _______________________________________________________________________ For Office Staff:  Number of people involved in session: 1 Was an Intern/ student involved with case: no

## 2022-12-06 ENCOUNTER — Inpatient Hospital Stay: Payer: 59

## 2022-12-06 ENCOUNTER — Encounter: Payer: Self-pay | Admitting: Licensed Clinical Social Worker

## 2022-12-06 ENCOUNTER — Inpatient Hospital Stay: Payer: 59 | Attending: Oncology | Admitting: Licensed Clinical Social Worker

## 2022-12-06 DIAGNOSIS — Z803 Family history of malignant neoplasm of breast: Secondary | ICD-10-CM

## 2022-12-06 DIAGNOSIS — Z8042 Family history of malignant neoplasm of prostate: Secondary | ICD-10-CM

## 2022-12-06 DIAGNOSIS — Z800 Family history of malignant neoplasm of digestive organs: Secondary | ICD-10-CM | POA: Diagnosis not present

## 2022-12-07 ENCOUNTER — Other Ambulatory Visit: Payer: Self-pay

## 2022-12-27 ENCOUNTER — Encounter: Payer: Self-pay | Admitting: Licensed Clinical Social Worker

## 2022-12-28 DIAGNOSIS — M25431 Effusion, right wrist: Secondary | ICD-10-CM | POA: Diagnosis not present

## 2022-12-28 DIAGNOSIS — M542 Cervicalgia: Secondary | ICD-10-CM | POA: Diagnosis not present

## 2022-12-28 DIAGNOSIS — M6281 Muscle weakness (generalized): Secondary | ICD-10-CM | POA: Diagnosis not present

## 2022-12-28 DIAGNOSIS — M5442 Lumbago with sciatica, left side: Secondary | ICD-10-CM | POA: Diagnosis not present

## 2022-12-28 DIAGNOSIS — M546 Pain in thoracic spine: Secondary | ICD-10-CM | POA: Diagnosis not present

## 2022-12-28 DIAGNOSIS — R2231 Localized swelling, mass and lump, right upper limb: Secondary | ICD-10-CM | POA: Diagnosis not present

## 2022-12-28 DIAGNOSIS — M5441 Lumbago with sciatica, right side: Secondary | ICD-10-CM | POA: Diagnosis not present

## 2022-12-28 DIAGNOSIS — R42 Dizziness and giddiness: Secondary | ICD-10-CM | POA: Diagnosis not present

## 2022-12-28 DIAGNOSIS — R519 Headache, unspecified: Secondary | ICD-10-CM | POA: Diagnosis not present

## 2022-12-28 DIAGNOSIS — M25531 Pain in right wrist: Secondary | ICD-10-CM | POA: Diagnosis not present

## 2022-12-31 ENCOUNTER — Other Ambulatory Visit: Payer: Self-pay

## 2022-12-31 ENCOUNTER — Encounter: Payer: Self-pay | Admitting: Licensed Clinical Social Worker

## 2022-12-31 ENCOUNTER — Ambulatory Visit: Payer: Self-pay | Admitting: Licensed Clinical Social Worker

## 2022-12-31 ENCOUNTER — Telehealth: Payer: Self-pay | Admitting: Licensed Clinical Social Worker

## 2022-12-31 DIAGNOSIS — Z1379 Encounter for other screening for genetic and chromosomal anomalies: Secondary | ICD-10-CM

## 2022-12-31 NOTE — Telephone Encounter (Signed)
I contacted Ms. Tracy Stevens to discuss her genetic testing results. No pathogenic variants were identified in the 76 genes analyzed. Detailed clinic note to follow.   The test report has been scanned into EPIC and is located under the Molecular Pathology section of the Results Review tab.  A portion of the result report is included below for reference.      Lacy Duverney, MS, [facility] Genetic Counselor [email or web address] Phone: [phone]

## 2022-12-31 NOTE — Progress Notes (Signed)
HPI:   Ms. Oltman was previously seen in the [place] [facility] clinic due to a family history of cancer and concerns regarding a hereditary predisposition to cancer. Please refer to our prior cancer genetics clinic note for more information regarding our discussion, assessment and recommendations, at the time. Ms. Menchaca recent genetic test results were disclosed to her, as were recommendations warranted by these results. These results and recommendations are discussed in more detail below.  CANCER HISTORY:  Oncology History   No history exists.    FAMILY HISTORY:  We obtained a detailed, 4-generation family history.  Significant diagnoses are listed below: Family History  Problem Relation Age of Onset   Stroke Mother    Multiple sclerosis Mother    Colon polyps Father    Asthma Sister    Epilepsy Sister    Diabetes Maternal Grandmother    Breast cancer Maternal Grandmother        dx late 40s-early 101s   Diabetes Maternal Grandfather    Prostate cancer Maternal Grandfather    Colon cancer Paternal Grandmother        d. 53s   Diabetes Paternal Grandfather    Heart disease Paternal Grandfather    Breast cancer Other        grandfather's sister   Breast cancer Maternal Great-grandmother        grandmother's mom   Colon cancer Maternal Great-grandmother        grandfather's mom    Ms. Buffum has 2 daughters, ages 39 and 55. She has 1 full sister. She has a maternal half sister whose father is patient's paternal uncle. She has another maternal half sister who has a different father, no cancers.   Ms. Whatley mother is living at 23, she has had colon polyps, unsure total number. Maternal grandmother had breast cancer in her late 15s-early 14s and passed at 62 of a different cause. Her mother had breast cancer as well. Maternal grandfather is living and has had prostate cancer. He has a sister who had breast cancer and his mother had colon cancer.   Ms. Kynard father  is living at 63 and has had colon polyps, unsure total number. He gets colonoscopies every 3 years. Paternal grandmother had colon cancer and passed from it in her 66s. Paternal grandfather is living in his 57s.   Ms. Koester is unaware of previous family history of genetic testing for hereditary cancer risks. There is no reported Ashkenazi Jewish ancestry. There is no known consanguinity.     GENETIC TEST RESULTS:  The [facility] CancerNext-Expanded+RNA Panel found no pathogenic mutations.   The CancerNext-Expanded gene panel offered by [facility] and includes sequencing, rearrangement, and RNA analysis for the following 76 genes: AIP, ALK, APC, ATM, AXIN2, BAP1, BARD1, BMPR1A, BRCA1, BRCA2, BRIP1, CDC73, CDH1, CDK4, CDKN1B, CDKN2A, CEBPA, CHEK2, CTNNA1, DDX41, DICER1, ETV6, FH, FLCN, GATA2, LZTR1, MAX, MBD4, MEN1, MET, MLH1, MSH2, MSH3, MSH6, MUTYH, NF1, NF2, NTHL1, PALB2, PHOX2B, PMS2, POT1, PRKAR1A, PTCH1, PTEN, RAD51C, RAD51D, RB1, RET, RUNX1, SDHA, SDHAF2, SDHB, SDHC, SDHD, SMAD4, SMARCA4, SMARCB1, SMARCE1, STK11, SUFU, TMEM127, TP53, TSC1, TSC2, VHL, and WT1 (sequencing and deletion/duplication); EGFR, HOXB13, KIT, MITF, PDGFRA, POLD1, and POLE (sequencing only); EPCAM and GREM1 (deletion/duplication only).   The test report has been scanned into EPIC and is located under the Molecular Pathology section of the Results Review tab.  A portion of the result report is included below for reference. Genetic testing reported out on 12/30/2022.  Even though a pathogenic variant was not identified, possible explanations for the cancer in the family may include: There may be no hereditary risk for cancer in the family. The cancers in Ms. Dobrowolski and/or her family may be sporadic/familial or due to other genetic and environmental factors. There may be a gene mutation in one of these genes that current testing methods cannot detect but that chance is small. There could be another gene that has not  yet been discovered, or that we have not yet tested, that is responsible for the cancer diagnoses in the family.  It is also possible there is a hereditary cause for the cancer in the family that Ms. Sanguinetti did not inherit.  Therefore, it is important to remain in touch with cancer genetics in the future so that we can continue to offer Ms. Mcduffee the most up to date genetic testing.   ADDITIONAL GENETIC TESTING:  We discussed with Ms. Pietila that her genetic testing was fairly extensive.  If there are additional relevant genes identified to increase cancer risk that can be analyzed in the future, we would be happy to discuss and coordinate this testing at that time.    CANCER SCREENING RECOMMENDATIONS:  Ms. Manzella test result is considered negative (normal).  This means that we have not identified a hereditary cause for her family history of cancer at this time.   An individual's cancer risk and medical management are not determined by genetic test results alone. Overall cancer risk assessment incorporates additional factors, including personal medical history, family history, and any available genetic information that may result in a personalized plan for cancer prevention and surveillance. Therefore, it is recommended she continue to follow the cancer management and screening guidelines provided by her primary healthcare provider.  RECOMMENDATIONS FOR FAMILY MEMBERS:   Since she did not inherit a identifiable mutation in a cancer predisposition gene included on this panel, her children could not have inherited a known mutation from her in one of these genes. Individuals in this family might be at some increased risk of developing cancer, over the general population risk, due to the family history of cancer.  Individuals in the family should notify their providers of the family history of cancer. We recommend women in this family have a yearly mammogram beginning at age 39, or 20 years younger  than the earliest onset of cancer, an annual clinical breast exam, and perform monthly breast self-exams.  Family members should have colonoscopies by at age 16, or earlier, as recommended by their providers. Other members of the family may still carry a pathogenic variant in one of these genes that Ms. Egnew did not inherit. Based on the family history, we recommend her maternal/paternal relatives have genetic counseling and testing. Ms. Viele will let us know if we can be of any assistance in coordinating genetic counseling and/or testing for this family member.    FOLLOW-UP:  Lastly, we discussed with Ms. Chubb that cancer genetics is a rapidly advancing field and it is possible that new genetic tests will be appropriate for her and/or her family members in the future. We encouraged her to remain in contact with cancer genetics on an annual basis so we can update her personal and family histories and let her know of advances in cancer genetics that may benefit this family.   Our contact number was provided. Ms. Busler questions were answered to her satisfaction, and she knows she is welcome to call us at anytime with additional  questions or concerns.    Lacy Duverney, MS, [facility] Genetic Counselor [email or web address] Phone: [phone]

## 2023-01-01 ENCOUNTER — Other Ambulatory Visit: Payer: Self-pay | Admitting: Family Medicine

## 2023-01-01 DIAGNOSIS — M25431 Effusion, right wrist: Secondary | ICD-10-CM

## 2023-01-01 DIAGNOSIS — R2231 Localized swelling, mass and lump, right upper limb: Secondary | ICD-10-CM

## 2023-01-01 DIAGNOSIS — M25531 Pain in right wrist: Secondary | ICD-10-CM

## 2023-01-01 DIAGNOSIS — G8929 Other chronic pain: Secondary | ICD-10-CM

## 2023-01-04 ENCOUNTER — Ambulatory Visit
Admission: RE | Admit: 2023-01-04 | Discharge: 2023-01-04 | Disposition: A | Discharge status: Home / Self Care | Payer: 59 | Source: Ambulatory Visit | Attending: Family Medicine | Admitting: Family Medicine

## 2023-01-04 DIAGNOSIS — M25431 Effusion, right wrist: Secondary | ICD-10-CM | POA: Diagnosis not present

## 2023-01-04 DIAGNOSIS — R2231 Localized swelling, mass and lump, right upper limb: Secondary | ICD-10-CM | POA: Insufficient documentation

## 2023-01-04 DIAGNOSIS — M7989 Other specified soft tissue disorders: Secondary | ICD-10-CM | POA: Diagnosis not present

## 2023-01-04 DIAGNOSIS — M25531 Pain in right wrist: Secondary | ICD-10-CM | POA: Diagnosis not present

## 2023-01-05 ENCOUNTER — Other Ambulatory Visit: Payer: 59

## 2023-01-05 ENCOUNTER — Ambulatory Visit
Admission: RE | Admit: 2023-01-05 | Discharge: 2023-01-05 | Disposition: A | Discharge status: Home / Self Care | Payer: 59 | Source: Ambulatory Visit | Attending: Family Medicine | Admitting: Family Medicine

## 2023-01-05 DIAGNOSIS — M79606 Pain in leg, unspecified: Secondary | ICD-10-CM | POA: Diagnosis not present

## 2023-01-05 DIAGNOSIS — G8929 Other chronic pain: Secondary | ICD-10-CM | POA: Diagnosis not present

## 2023-01-05 DIAGNOSIS — M5442 Lumbago with sciatica, left side: Secondary | ICD-10-CM | POA: Insufficient documentation

## 2023-01-05 DIAGNOSIS — M47816 Spondylosis without myelopathy or radiculopathy, lumbar region: Secondary | ICD-10-CM | POA: Diagnosis not present

## 2023-01-05 DIAGNOSIS — M545 Low back pain, unspecified: Secondary | ICD-10-CM | POA: Diagnosis not present

## 2023-01-05 DIAGNOSIS — M5117 Intervertebral disc disorders with radiculopathy, lumbosacral region: Secondary | ICD-10-CM | POA: Diagnosis not present

## 2023-01-05 DIAGNOSIS — M5441 Lumbago with sciatica, right side: Secondary | ICD-10-CM | POA: Diagnosis not present

## 2023-01-05 MED ORDER — GADOBUTROL 1 MMOL/ML IV SOLN
9.0000 mL | Freq: Once | INTRAVENOUS | Status: AC | PRN
  Administered 2023-01-05: 9 mL via INTRAVENOUS

## 2023-01-17 ENCOUNTER — Encounter: Payer: Self-pay | Admitting: Certified Nurse Midwife

## 2023-02-11 ENCOUNTER — Other Ambulatory Visit: Payer: Self-pay

## 2023-03-21 ENCOUNTER — Other Ambulatory Visit: Payer: Self-pay

## 2023-03-22 ENCOUNTER — Other Ambulatory Visit: Payer: Self-pay

## 2023-03-22 MED ORDER — CYCLOBENZAPRINE HCL 5 MG PO TABS
5.0000 mg | ORAL_TABLET | Freq: Every day | ORAL | 2 refills | Status: DC | PRN
Start: 2023-03-22 — End: 2023-07-29
  Filled 2023-03-22: qty 30, 30d supply, fill #0
  Filled 2023-04-29 – 2023-05-14 (×3): qty 30, 30d supply, fill #1
  Filled 2023-06-12 – 2023-06-25 (×2): qty 30, 30d supply, fill #2

## 2023-04-22 DIAGNOSIS — M25531 Pain in right wrist: Secondary | ICD-10-CM | POA: Diagnosis not present

## 2023-04-22 DIAGNOSIS — M67431 Ganglion, right wrist: Secondary | ICD-10-CM | POA: Diagnosis not present

## 2023-05-13 ENCOUNTER — Other Ambulatory Visit: Payer: Self-pay

## 2023-05-14 ENCOUNTER — Other Ambulatory Visit: Payer: Self-pay

## 2023-05-30 DIAGNOSIS — Z6837 Body mass index (BMI) 37.0-37.9, adult: Secondary | ICD-10-CM | POA: Diagnosis not present

## 2023-05-30 DIAGNOSIS — G4733 Obstructive sleep apnea (adult) (pediatric): Secondary | ICD-10-CM | POA: Diagnosis not present

## 2023-05-30 DIAGNOSIS — F411 Generalized anxiety disorder: Secondary | ICD-10-CM | POA: Diagnosis not present

## 2023-05-30 DIAGNOSIS — G43111 Migraine with aura, intractable, with status migrainosus: Secondary | ICD-10-CM | POA: Diagnosis not present

## 2023-05-30 DIAGNOSIS — F330 Major depressive disorder, recurrent, mild: Secondary | ICD-10-CM | POA: Diagnosis not present

## 2023-05-30 DIAGNOSIS — L659 Nonscarring hair loss, unspecified: Secondary | ICD-10-CM | POA: Diagnosis not present

## 2023-05-31 ENCOUNTER — Other Ambulatory Visit: Payer: Self-pay

## 2023-05-31 DIAGNOSIS — G4733 Obstructive sleep apnea (adult) (pediatric): Secondary | ICD-10-CM | POA: Diagnosis not present

## 2023-05-31 DIAGNOSIS — M5126 Other intervertebral disc displacement, lumbar region: Secondary | ICD-10-CM | POA: Diagnosis not present

## 2023-05-31 DIAGNOSIS — Z0000 Encounter for general adult medical examination without abnormal findings: Secondary | ICD-10-CM | POA: Diagnosis not present

## 2023-05-31 DIAGNOSIS — Z1331 Encounter for screening for depression: Secondary | ICD-10-CM | POA: Diagnosis not present

## 2023-05-31 MED ORDER — TIRZEPATIDE-WEIGHT MANAGEMENT 2.5 MG/0.5ML ~~LOC~~ SOAJ
2.5000 mg | SUBCUTANEOUS | 12 refills | Status: AC
  Filled 2023-10-19: qty 2, 28d supply, fill #0
  Filled ????-??-??: fill #0

## 2023-06-11 ENCOUNTER — Other Ambulatory Visit: Payer: Self-pay

## 2023-06-11 MED ORDER — AZITHROMYCIN 250 MG PO TABS
ORAL_TABLET | ORAL | 0 refills | Status: DC
  Filled 2023-06-11: qty 6, 5d supply, fill #0

## 2023-06-11 MED ORDER — PREDNISONE 10 MG PO TABS
ORAL_TABLET | ORAL | 0 refills | Status: DC
Start: 2023-06-11 — End: 2023-12-02
  Filled 2023-06-11: qty 20, 8d supply, fill #0

## 2023-06-12 ENCOUNTER — Other Ambulatory Visit: Payer: Self-pay

## 2023-06-24 ENCOUNTER — Other Ambulatory Visit: Payer: Self-pay

## 2023-06-25 ENCOUNTER — Other Ambulatory Visit: Payer: Self-pay

## 2023-07-10 ENCOUNTER — Other Ambulatory Visit: Payer: Self-pay

## 2023-07-11 ENCOUNTER — Other Ambulatory Visit: Payer: Self-pay

## 2023-07-11 MED ORDER — VENLAFAXINE HCL ER 75 MG PO CP24
75.0000 mg | ORAL_CAPSULE | Freq: Every day | ORAL | 1 refills | Status: DC
  Filled 2023-07-11: qty 90, 90d supply, fill #0
  Filled 2023-10-14: qty 90, 90d supply, fill #1

## 2023-07-26 ENCOUNTER — Other Ambulatory Visit: Payer: Self-pay

## 2023-07-29 ENCOUNTER — Other Ambulatory Visit: Payer: Self-pay

## 2023-07-29 MED ORDER — CYCLOBENZAPRINE HCL 5 MG PO TABS
5.0000 mg | ORAL_TABLET | Freq: Every day | ORAL | 2 refills | Status: AC | PRN
  Filled 2023-07-29: qty 30, 30d supply, fill #0
  Filled 2023-08-30: qty 30, 30d supply, fill #1

## 2023-08-18 ENCOUNTER — Other Ambulatory Visit: Payer: Self-pay

## 2023-08-19 ENCOUNTER — Other Ambulatory Visit: Payer: Self-pay

## 2023-08-19 MED ORDER — SERTRALINE HCL 50 MG PO TABS
75.0000 mg | ORAL_TABLET | Freq: Every day | ORAL | 1 refills | Status: DC
  Filled 2023-08-19: qty 135, 90d supply, fill #0
  Filled 2023-11-06: qty 135, 90d supply, fill #1

## 2023-08-30 ENCOUNTER — Other Ambulatory Visit: Payer: Self-pay

## 2023-09-02 ENCOUNTER — Other Ambulatory Visit: Payer: Self-pay

## 2023-09-02 MED ORDER — PROPRANOLOL HCL 10 MG PO TABS
10.0000 mg | ORAL_TABLET | Freq: Two times a day (BID) | ORAL | 1 refills | Status: AC
  Filled 2023-09-02 – 2023-09-17 (×2): qty 180, 90d supply, fill #0

## 2023-09-13 ENCOUNTER — Other Ambulatory Visit: Payer: Self-pay

## 2023-09-17 ENCOUNTER — Other Ambulatory Visit: Payer: Self-pay

## 2023-10-01 ENCOUNTER — Other Ambulatory Visit: Payer: Self-pay

## 2023-10-01 MED ORDER — CYCLOBENZAPRINE HCL 5 MG PO TABS
5.0000 mg | ORAL_TABLET | Freq: Every day | ORAL | 2 refills | Status: AC | PRN
  Filled 2023-10-01: qty 30, 30d supply, fill #0
  Filled 2023-11-06: qty 30, 30d supply, fill #1

## 2023-10-20 ENCOUNTER — Other Ambulatory Visit: Payer: Self-pay

## 2023-12-02 ENCOUNTER — Other Ambulatory Visit (HOSPITAL_BASED_OUTPATIENT_CLINIC_OR_DEPARTMENT_OTHER): Payer: Self-pay

## 2023-12-02 ENCOUNTER — Other Ambulatory Visit: Payer: Self-pay

## 2023-12-02 DIAGNOSIS — T8332XA Displacement of intrauterine contraceptive device, initial encounter: Secondary | ICD-10-CM | POA: Diagnosis not present

## 2023-12-02 DIAGNOSIS — N898 Other specified noninflammatory disorders of vagina: Secondary | ICD-10-CM | POA: Diagnosis not present

## 2023-12-02 DIAGNOSIS — G4733 Obstructive sleep apnea (adult) (pediatric): Secondary | ICD-10-CM | POA: Diagnosis not present

## 2023-12-02 DIAGNOSIS — G8929 Other chronic pain: Secondary | ICD-10-CM | POA: Diagnosis not present

## 2023-12-02 DIAGNOSIS — F411 Generalized anxiety disorder: Secondary | ICD-10-CM | POA: Diagnosis not present

## 2023-12-02 DIAGNOSIS — R5383 Other fatigue: Secondary | ICD-10-CM | POA: Diagnosis not present

## 2023-12-02 DIAGNOSIS — Z6835 Body mass index (BMI) 35.0-35.9, adult: Secondary | ICD-10-CM | POA: Diagnosis not present

## 2023-12-02 DIAGNOSIS — F330 Major depressive disorder, recurrent, mild: Secondary | ICD-10-CM | POA: Diagnosis not present

## 2023-12-02 DIAGNOSIS — G43111 Migraine with aura, intractable, with status migrainosus: Secondary | ICD-10-CM | POA: Diagnosis not present

## 2023-12-02 DIAGNOSIS — E559 Vitamin D deficiency, unspecified: Secondary | ICD-10-CM | POA: Diagnosis not present

## 2023-12-02 DIAGNOSIS — M5441 Lumbago with sciatica, right side: Secondary | ICD-10-CM | POA: Diagnosis not present

## 2023-12-02 DIAGNOSIS — M5442 Lumbago with sciatica, left side: Secondary | ICD-10-CM | POA: Diagnosis not present

## 2023-12-02 MED ORDER — VENLAFAXINE HCL ER 75 MG PO CP24
75.0000 mg | ORAL_CAPSULE | Freq: Every day | ORAL | 1 refills | Status: AC
  Filled 2023-12-02 – 2024-01-13 (×2): qty 90, 90d supply, fill #0

## 2023-12-02 MED ORDER — PROPRANOLOL HCL 10 MG PO TABS
10.0000 mg | ORAL_TABLET | Freq: Two times a day (BID) | ORAL | 1 refills | Status: AC
  Filled 2023-12-02 – 2023-12-18 (×3): qty 180, 90d supply, fill #0

## 2023-12-02 MED ORDER — SERTRALINE HCL 50 MG PO TABS
75.0000 mg | ORAL_TABLET | Freq: Every day | ORAL | 1 refills | Status: AC
  Filled 2023-12-02 – 2024-02-13 (×2): qty 135, 90d supply, fill #0

## 2023-12-02 MED ORDER — CYCLOBENZAPRINE HCL 5 MG PO TABS
5.0000 mg | ORAL_TABLET | Freq: Every day | ORAL | 2 refills | Status: AC | PRN
  Filled 2023-12-02 – 2023-12-18 (×3): qty 30, 30d supply, fill #0
  Filled 2024-01-13: qty 30, 30d supply, fill #1
  Filled 2024-02-13: qty 30, 30d supply, fill #2

## 2023-12-04 ENCOUNTER — Other Ambulatory Visit: Payer: Self-pay

## 2023-12-04 MED ORDER — VITAMIN D (ERGOCALCIFEROL) 1.25 MG (50000 UNIT) PO CAPS
50000.0000 [IU] | ORAL_CAPSULE | ORAL | 1 refills | Status: AC
  Filled 2023-12-04 – 2023-12-18 (×2): qty 12, 84d supply, fill #0

## 2023-12-09 ENCOUNTER — Other Ambulatory Visit: Payer: Self-pay | Admitting: Medical Genetics

## 2023-12-10 ENCOUNTER — Other Ambulatory Visit
Admission: RE | Admit: 2023-12-10 | Discharge: 2023-12-10 | Disposition: A | Discharge status: Home / Self Care | Payer: Self-pay | Source: Ambulatory Visit | Attending: Medical Genetics | Admitting: Medical Genetics

## 2023-12-16 ENCOUNTER — Other Ambulatory Visit: Payer: Self-pay

## 2023-12-18 ENCOUNTER — Other Ambulatory Visit: Payer: Self-pay

## 2023-12-24 LAB — GENECONNECT MOLECULAR SCREEN: Genetic Analysis Overall Interpretation: NEGATIVE

## 2023-12-31 ENCOUNTER — Telehealth: Admitting: Family Medicine

## 2023-12-31 ENCOUNTER — Other Ambulatory Visit: Payer: Self-pay

## 2023-12-31 DIAGNOSIS — B9689 Other specified bacterial agents as the cause of diseases classified elsewhere: Secondary | ICD-10-CM

## 2023-12-31 DIAGNOSIS — J019 Acute sinusitis, unspecified: Secondary | ICD-10-CM

## 2023-12-31 MED ORDER — AMOXICILLIN-POT CLAVULANATE 875-125 MG PO TABS
1.0000 | ORAL_TABLET | Freq: Two times a day (BID) | ORAL | 0 refills | Status: AC
  Filled 2023-12-31: qty 14, 7d supply, fill #0

## 2023-12-31 MED ORDER — IPRATROPIUM BROMIDE 0.03 % NA SOLN
2.0000 | Freq: Two times a day (BID) | NASAL | 0 refills | Status: AC
  Filled 2023-12-31: qty 30, 75d supply, fill #0

## 2023-12-31 NOTE — Progress Notes (Signed)
 E-Visit for Sinus Problems  We are sorry that you are not feeling well.  Here is how we plan to help!  Based on what you have shared with me it looks like you have sinusitis.  Sinusitis is inflammation and infection in the sinus cavities of the head.  Based on your presentation I believe you most likely have Acute Bacterial Sinusitis.  This is an infection caused by bacteria and is treated with antibiotics. I have prescribed Augmentin 875mg /125mg  one tablet twice daily with food, for 7 days. and I have also prescribed Ipratropium Bromide Nasal Spray Use 1 spray in each nostril twice daily as needed for drainage; discontinue if too drying You may use an oral decongestant such as Mucinex  D or if you have glaucoma or high blood pressure use plain Mucinex . Saline nasal spray help and can safely be used as often as needed for congestion.  If you develop worsening sinus pain, fever or notice severe headache and vision changes, or if symptoms are not better after completion of antibiotic, please schedule an appointment with a health care provider.    Sinus infections are not as easily transmitted as other respiratory infection, however we still recommend that you avoid close contact with loved ones, especially the very young and elderly.  Remember to wash your hands thoroughly throughout the day as this is the number one way to prevent the spread of infection!  Home Care: Only take medications as instructed by your medical team. Complete the entire course of an antibiotic. Do not take these medications with alcohol. A steam or ultrasonic humidifier can help congestion.  You can place a towel over your head and breathe in the steam from hot water coming from a faucet. Avoid close contacts especially the very young and the elderly. Cover your mouth when you cough or sneeze. Always remember to wash your hands.  Get Help Right Away If: You develop worsening fever or sinus pain. You develop a severe head  ache or visual changes. Your symptoms persist after you have completed your treatment plan.  Make sure you Understand these instructions. Will watch your condition. Will get help right away if you are not doing well or get worse.  Your e-visit answers were reviewed by a board certified advanced clinical practitioner to complete your personal care plan.  Depending on the condition, your plan could have included both over the counter or prescription medications.  If there is a problem please reply  once you have received a response from your provider.  Your safety is important to us .  If you have drug allergies check your prescription carefully.    You can use MyChart to ask questions about today's visit, request a non-urgent call back, or ask for a work or school excuse for 24 hours related to this e-Visit. If it has been greater than 24 hours you will need to follow up with your provider, or enter a new e-Visit to address those concerns.  You will get an e-mail in the next two days asking about your experience.  I hope that your e-visit has been valuable and will speed your recovery. Thank you for using e-visits.  I have spent 5 minutes in review of e-visit questionnaire, review and updating patient chart, medical decision making and response to patient.   Chiquita CHRISTELLA Barefoot, NP

## 2024-01-13 ENCOUNTER — Other Ambulatory Visit: Payer: Self-pay

## 2024-02-13 ENCOUNTER — Other Ambulatory Visit: Payer: Self-pay

## 2024-02-14 ENCOUNTER — Other Ambulatory Visit: Payer: Self-pay

## 2024-02-14 MED ORDER — SUMATRIPTAN SUCCINATE 25 MG PO TABS
25.0000 mg | ORAL_TABLET | ORAL | 5 refills | Status: AC | PRN
  Filled 2024-02-14: qty 9, 30d supply, fill #0
